# Patient Record
Sex: Female | Born: 1935 | Race: White | Hispanic: No | Marital: Married | Smoking: Never smoker
Health system: Southern US, Community
[De-identification: ages and names within clinical notes are randomized; demographics above are authoritative.]

## PROBLEM LIST (undated history)

## (undated) DIAGNOSIS — E079 Disorder of thyroid, unspecified: Secondary | ICD-10-CM

## (undated) DIAGNOSIS — Z950 Presence of cardiac pacemaker: Secondary | ICD-10-CM

## (undated) DIAGNOSIS — I4891 Unspecified atrial fibrillation: Secondary | ICD-10-CM

## (undated) DIAGNOSIS — I10 Essential (primary) hypertension: Secondary | ICD-10-CM

## (undated) HISTORY — PX: KNEE SURGERY: SHX244

---

## 2005-10-14 ENCOUNTER — Ambulatory Visit: Payer: Self-pay | Admitting: Unknown Physician Specialty

## 2006-05-30 ENCOUNTER — Other Ambulatory Visit: Payer: Self-pay

## 2006-05-30 ENCOUNTER — Inpatient Hospital Stay: Payer: Self-pay | Admitting: Internal Medicine

## 2006-07-11 ENCOUNTER — Other Ambulatory Visit: Payer: Self-pay

## 2006-07-11 ENCOUNTER — Inpatient Hospital Stay: Payer: Self-pay | Admitting: Internal Medicine

## 2006-09-03 ENCOUNTER — Emergency Department: Payer: Self-pay | Admitting: Emergency Medicine

## 2006-11-04 ENCOUNTER — Ambulatory Visit: Payer: Self-pay | Admitting: Internal Medicine

## 2006-12-11 ENCOUNTER — Inpatient Hospital Stay: Payer: Self-pay | Admitting: Internal Medicine

## 2006-12-11 ENCOUNTER — Other Ambulatory Visit: Payer: Self-pay

## 2009-04-25 ENCOUNTER — Ambulatory Visit: Payer: Self-pay | Admitting: Internal Medicine

## 2009-06-19 ENCOUNTER — Ambulatory Visit: Payer: Self-pay | Admitting: Cardiology

## 2009-06-20 ENCOUNTER — Ambulatory Visit: Payer: Self-pay | Admitting: Cardiology

## 2009-08-05 ENCOUNTER — Emergency Department: Payer: Self-pay | Admitting: Emergency Medicine

## 2010-02-06 ENCOUNTER — Ambulatory Visit: Payer: Self-pay | Admitting: Ophthalmology

## 2010-02-28 ENCOUNTER — Ambulatory Visit: Payer: Self-pay | Admitting: Ophthalmology

## 2010-05-16 ENCOUNTER — Ambulatory Visit: Payer: Self-pay | Admitting: Otolaryngology

## 2010-05-17 ENCOUNTER — Ambulatory Visit: Payer: Self-pay | Admitting: Otolaryngology

## 2010-05-18 LAB — PATHOLOGY REPORT

## 2011-08-01 ENCOUNTER — Ambulatory Visit: Payer: Self-pay | Admitting: Dermatology

## 2011-11-15 LAB — COMPREHENSIVE METABOLIC PANEL
Albumin: 3.4 g/dL (ref 3.4–5.0)
Bilirubin,Total: 1.4 mg/dL — ABNORMAL HIGH (ref 0.2–1.0)
Calcium, Total: 9.2 mg/dL (ref 8.5–10.1)
Co2: 29 mmol/L (ref 21–32)
Creatinine: 0.48 mg/dL — ABNORMAL LOW (ref 0.60–1.30)
EGFR (African American): >60
Glucose: 88 mg/dL (ref 65–99)
Osmolality: 261 (ref 275–301)
Potassium: 3.8 mmol/L (ref 3.5–5.1)
SGOT(AST): 31 U/L (ref 15–37)
Sodium: 130 mmol/L — ABNORMAL LOW (ref 136–145)

## 2011-11-15 LAB — URINALYSIS, COMPLETE
Bacteria: NONE SEEN
Bilirubin,UR: NEGATIVE
Ph: 6 (ref 4.5–8.0)
Specific Gravity: 1.009 (ref 1.003–1.030)
Squamous Epithelial: <1
WBC UR: 9 /HPF (ref 0–5)

## 2011-11-15 LAB — CBC
HCT: 43 % (ref 35.0–47.0)
HGB: 14.3 g/dL (ref 12.0–16.0)
MCHC: 33.3 g/dL (ref 32.0–36.0)
Platelet: 165 10*3/uL (ref 150–440)
RDW: 13.2 % (ref 11.5–14.5)
WBC: 6.7 10*3/uL (ref 3.6–11.0)

## 2011-11-15 LAB — TROPONIN I: Troponin-I: <0.02 ng/mL

## 2011-11-15 LAB — PROTIME-INR: Prothrombin Time: 13.8 secs (ref 11.5–14.7)

## 2011-11-16 ENCOUNTER — Inpatient Hospital Stay: Payer: Self-pay | Admitting: Internal Medicine

## 2011-11-17 LAB — CBC WITH DIFFERENTIAL/PLATELET
Basophil #: 0 10*3/uL (ref 0.0–0.1)
Eosinophil #: 0.2 10*3/uL (ref 0.0–0.7)
Eosinophil %: 2.7 %
Lymphocyte #: 0.9 10*3/uL — ABNORMAL LOW (ref 1.0–3.6)
Lymphocyte %: 15.8 %
MCV: 96 fL (ref 80–100)
Monocyte #: 0.8 10*3/uL — ABNORMAL HIGH (ref 0.0–0.7)
Monocyte %: 13.4 %
Neutrophil #: 3.8 10*3/uL (ref 1.4–6.5)
Neutrophil %: 67.6 %
Platelet: 156 10*3/uL (ref 150–440)
RBC: 3.89 10*6/uL (ref 3.80–5.20)
RDW: 13.2 % (ref 11.5–14.5)
WBC: 5.7 10*3/uL (ref 3.6–11.0)

## 2011-11-17 LAB — BASIC METABOLIC PANEL
Anion Gap: 11 (ref 7–16)
Chloride: 93 mmol/L — ABNORMAL LOW (ref 98–107)
Co2: 26 mmol/L (ref 21–32)
Creatinine: 0.41 mg/dL — ABNORMAL LOW (ref 0.60–1.30)
EGFR (African American): >60
Osmolality: 259 (ref 275–301)
Sodium: 130 mmol/L — ABNORMAL LOW (ref 136–145)

## 2012-02-17 ENCOUNTER — Emergency Department: Payer: Self-pay | Admitting: Emergency Medicine

## 2012-02-17 LAB — COMPREHENSIVE METABOLIC PANEL
Albumin: 3.9 g/dL (ref 3.4–5.0)
Alkaline Phosphatase: 44 U/L — ABNORMAL LOW (ref 50–136)
Anion Gap: 9 (ref 7–16)
BUN: 16 mg/dL (ref 7–18)
Bilirubin,Total: 2.1 mg/dL — ABNORMAL HIGH (ref 0.2–1.0)
Calcium, Total: 9.2 mg/dL (ref 8.5–10.1)
Chloride: 87 mmol/L — ABNORMAL LOW (ref 98–107)
Co2: 31 mmol/L (ref 21–32)
EGFR (African American): >60
Potassium: 4.2 mmol/L (ref 3.5–5.1)
Sodium: 127 mmol/L — ABNORMAL LOW (ref 136–145)
Total Protein: 7.9 g/dL (ref 6.4–8.2)

## 2012-02-17 LAB — URINALYSIS, COMPLETE
Bilirubin,UR: NEGATIVE
Glucose,UR: NEGATIVE mg/dL (ref 0–75)
Leukocyte Esterase: NEGATIVE
Ph: 6 (ref 4.5–8.0)
Protein: NEGATIVE
RBC,UR: 5 /HPF (ref 0–5)
Specific Gravity: 1.017 (ref 1.003–1.030)
Squamous Epithelial: NONE SEEN

## 2012-02-17 LAB — CBC
HGB: 13.4 g/dL (ref 12.0–16.0)
MCH: 32.2 pg (ref 26.0–34.0)
MCHC: 33.1 g/dL (ref 32.0–36.0)
Platelet: 211 10*3/uL (ref 150–440)
RBC: 4.16 10*6/uL (ref 3.80–5.20)
RDW: 15 % — ABNORMAL HIGH (ref 11.5–14.5)
WBC: 7.6 10*3/uL (ref 3.6–11.0)

## 2012-02-17 LAB — TROPONIN I: Troponin-I: <0.02 ng/mL

## 2012-02-17 LAB — PROTIME-INR: INR: 1.1

## 2012-05-22 ENCOUNTER — Encounter: Payer: Self-pay | Admitting: Family Medicine

## 2013-02-24 IMAGING — CT CT HEAD WITHOUT CONTRAST
1 series · 16 of 30 positions shown, 20 images · non-contrast
Comparison: none

REASON FOR EXAM: weak
COMMENTS:

PROCEDURE:     CT  - CT HEAD WITHOUT CONTRAST  - November 15, 2011  [DATE]
RESULT:     History: Weak.
Comparison Study: No recent.

[Series 2: soft tissue · axial · 0.42mm/px · z∈[+364,+498]mm · 16 of 31 slices shown, 20 images]
[im 2/31  brain]
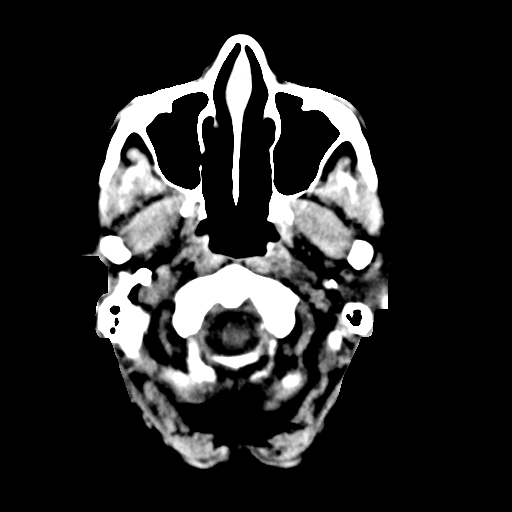
[im 2/31  bone]
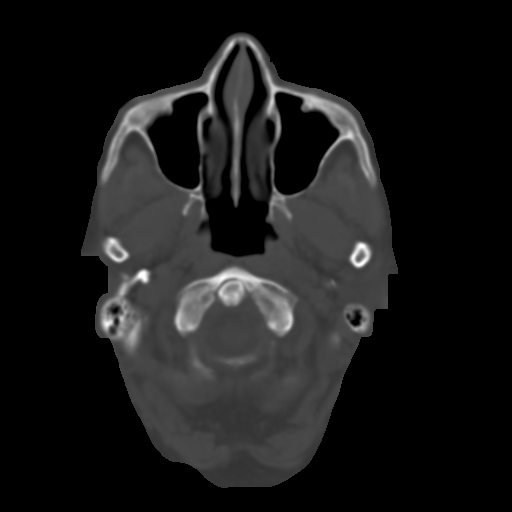
[im 4/31  brain]
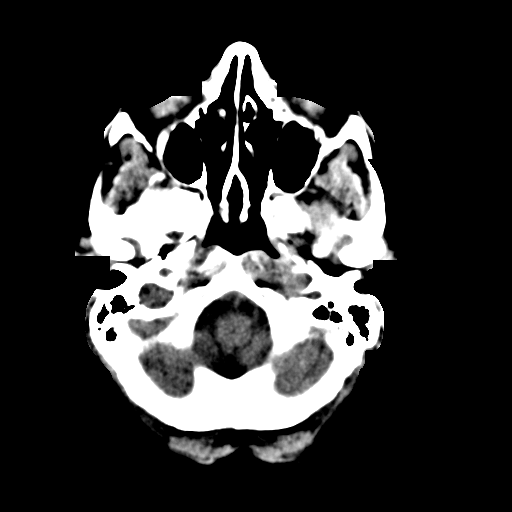
[im 6/31  brain]
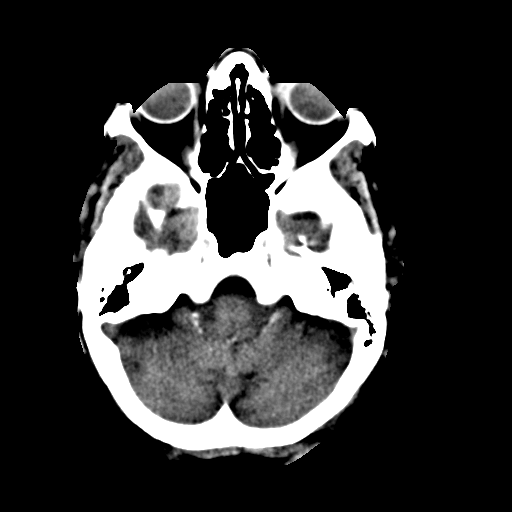
[im 8/31  brain]
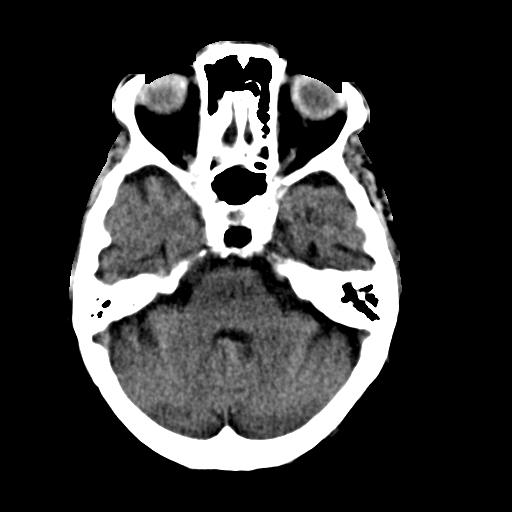
[im 9/31  brain]
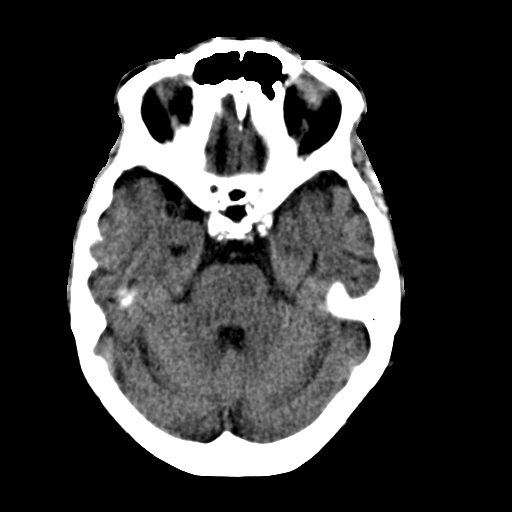
[im 9/31  bone]
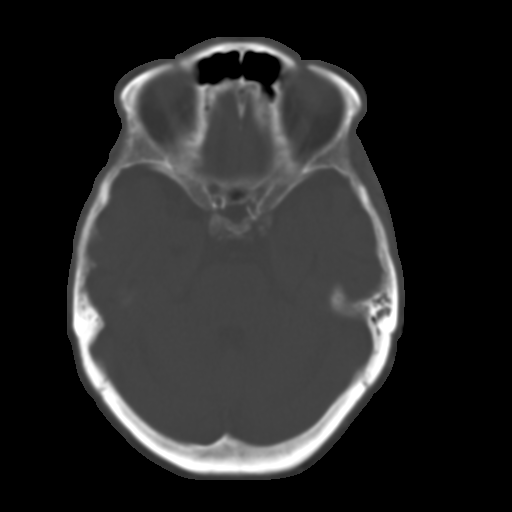
[im 11/31  brain]
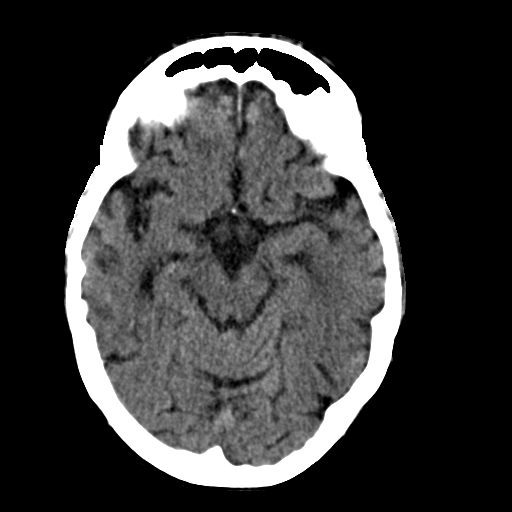
[im 13/31  brain]
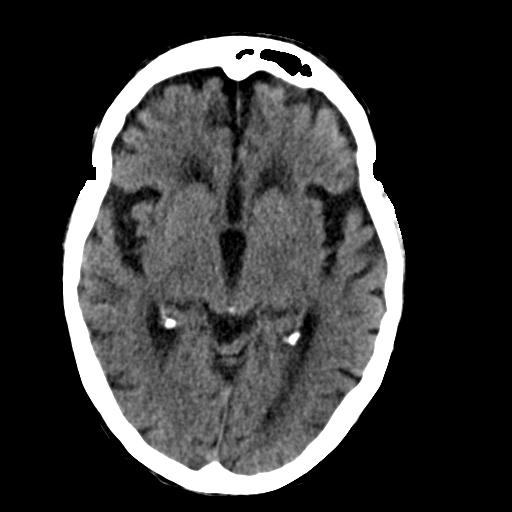
[im 15/31  brain]
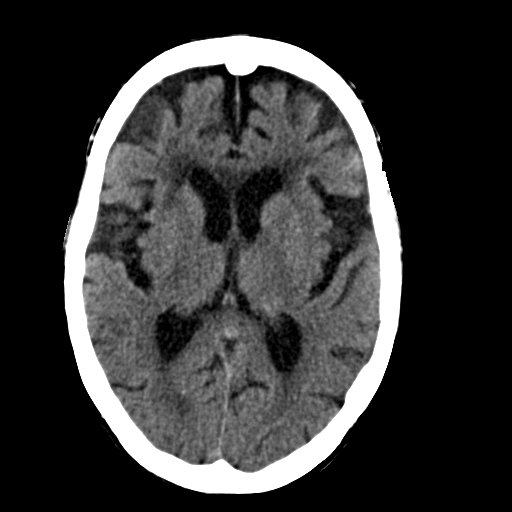
[im 16/31  brain]
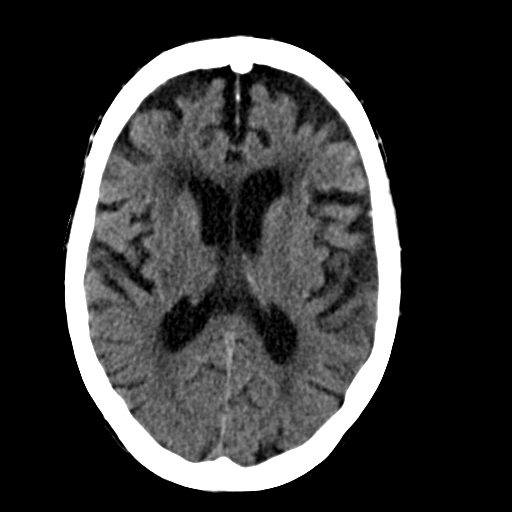
[im 16/31  bone]
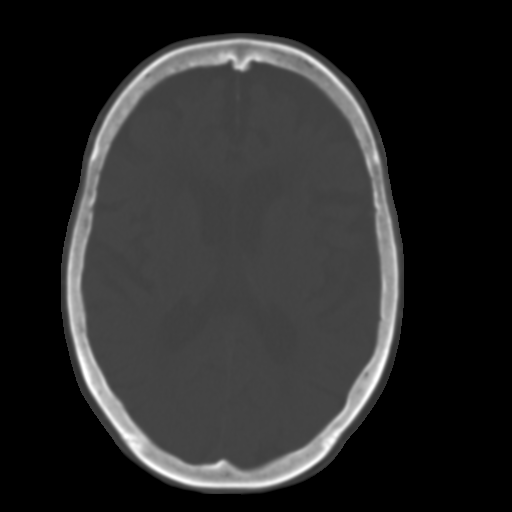
[im 18/31  brain]
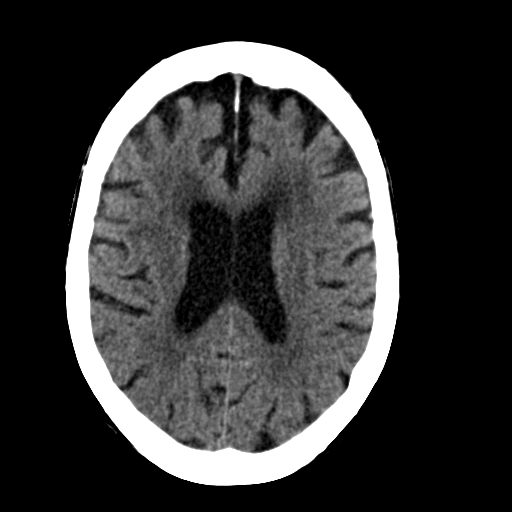
[im 20/31  brain]
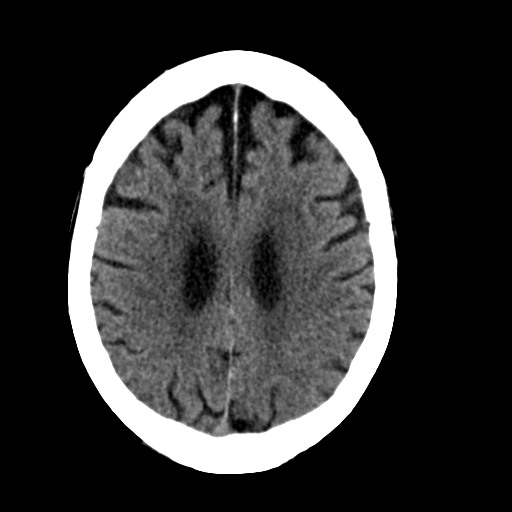
[im 22/31  brain]
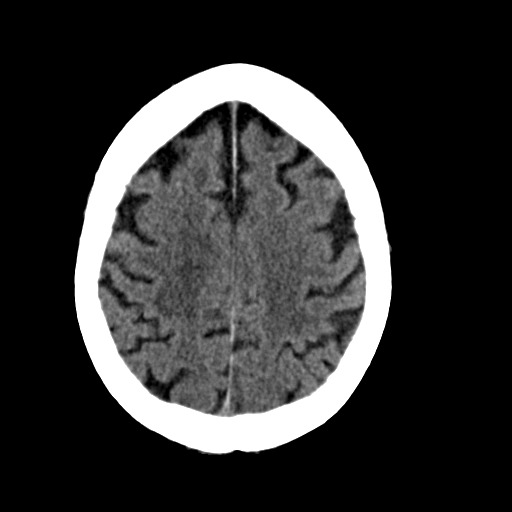
[im 23/31  brain]
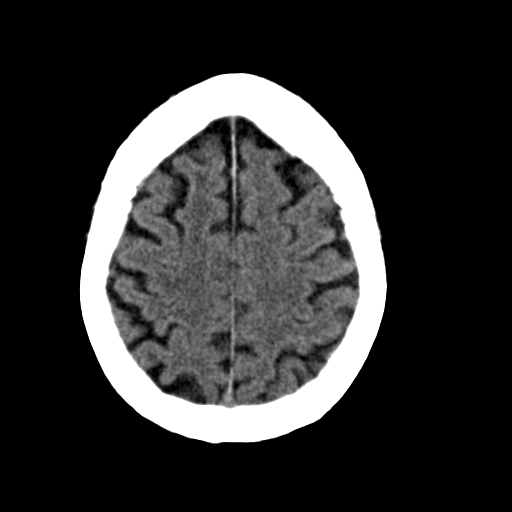
[im 23/31  bone]
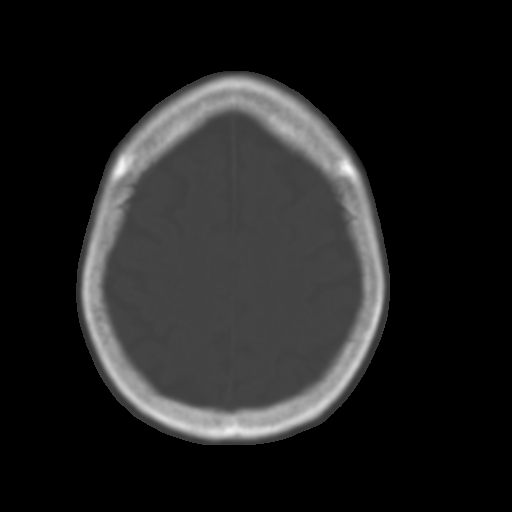
[im 25/31  brain]
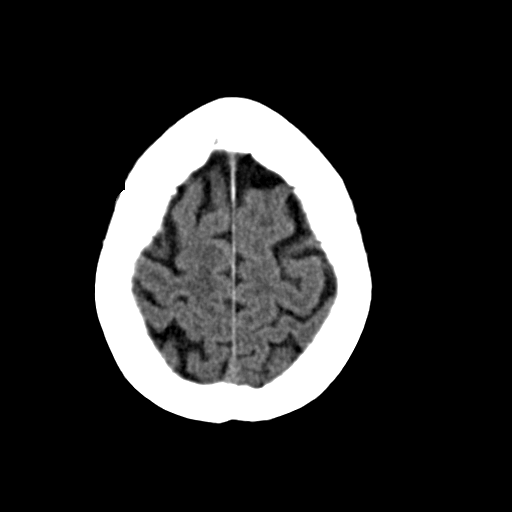
[im 27/31  brain]
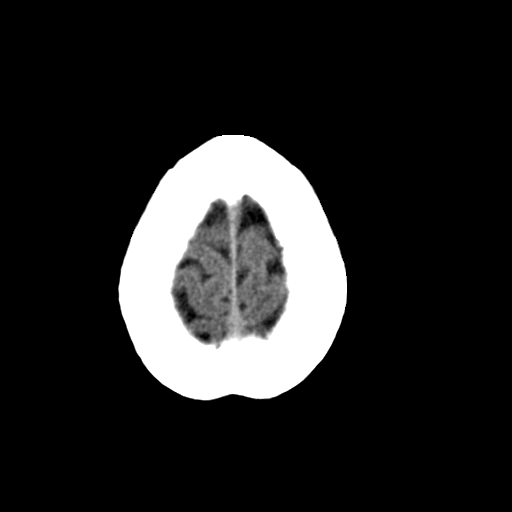
[im 29/31  brain]
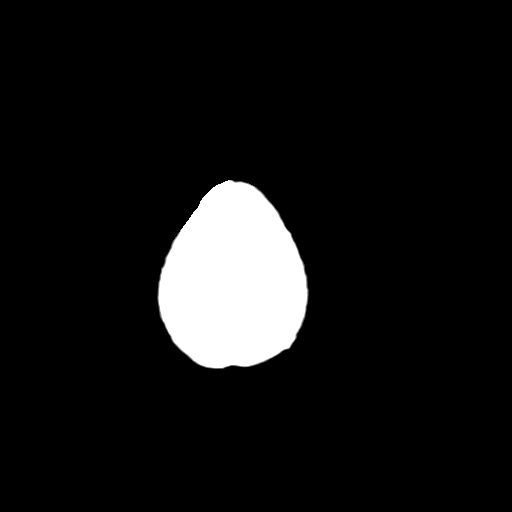

[16 of 30 positions shown; findings below may reference images not displayed]

FINDINGS: Standard nonenhanced scan obtained. No mass. No hemorrhage. No
hydrocephalus. White matter changes consistent with chronic ischemia. No
acute bony abnormality.
IMPRESSION: No acute abnormality.

## 2014-10-03 ENCOUNTER — Emergency Department (HOSPITAL_COMMUNITY): Payer: Medicare Other

## 2014-10-03 ENCOUNTER — Emergency Department (HOSPITAL_COMMUNITY)
Admission: EM | Admit: 2014-10-03 | Discharge: 2014-10-03 | Disposition: A | Discharge status: Home / Self Care | Payer: Medicare Other | Attending: Emergency Medicine | Admitting: Emergency Medicine

## 2014-10-03 ENCOUNTER — Encounter (HOSPITAL_COMMUNITY): Payer: Self-pay | Admitting: *Deleted

## 2014-10-03 DIAGNOSIS — S0003XA Contusion of scalp, initial encounter: Secondary | ICD-10-CM | POA: Diagnosis not present

## 2014-10-03 DIAGNOSIS — W01198A Fall on same level from slipping, tripping and stumbling with subsequent striking against other object, initial encounter: Secondary | ICD-10-CM | POA: Insufficient documentation

## 2014-10-03 DIAGNOSIS — Y9289 Other specified places as the place of occurrence of the external cause: Secondary | ICD-10-CM | POA: Insufficient documentation

## 2014-10-03 DIAGNOSIS — Y9301 Activity, walking, marching and hiking: Secondary | ICD-10-CM | POA: Insufficient documentation

## 2014-10-03 DIAGNOSIS — W19XXXA Unspecified fall, initial encounter: Secondary | ICD-10-CM

## 2014-10-03 DIAGNOSIS — I10 Essential (primary) hypertension: Secondary | ICD-10-CM | POA: Diagnosis not present

## 2014-10-03 DIAGNOSIS — Z8639 Personal history of other endocrine, nutritional and metabolic disease: Secondary | ICD-10-CM | POA: Insufficient documentation

## 2014-10-03 DIAGNOSIS — Y998 Other external cause status: Secondary | ICD-10-CM | POA: Insufficient documentation

## 2014-10-03 DIAGNOSIS — T148XXA Other injury of unspecified body region, initial encounter: Secondary | ICD-10-CM

## 2014-10-03 DIAGNOSIS — S0990XA Unspecified injury of head, initial encounter: Secondary | ICD-10-CM | POA: Diagnosis present

## 2014-10-03 HISTORY — DX: Unspecified atrial fibrillation: I48.91

## 2014-10-03 HISTORY — DX: Essential (primary) hypertension: I10

## 2014-10-03 HISTORY — DX: Presence of cardiac pacemaker: Z95.0

## 2014-10-03 HISTORY — DX: Disorder of thyroid, unspecified: E07.9

## 2014-10-03 NOTE — ED Provider Notes (Signed)
CSN: [id number]     Arrival date & time 10/03/14  1747 History   First MD Initiated Contact with Patient 10/03/14 1753     Chief Complaint  Patient presents with  . Fall     (Consider location/radiation/quality/duration/timing/severity/associated sxs/prior Treatment) Patient is a 80 y.o. female presenting with fall.  Fall This is a recurrent problem. The current episode started today. The problem occurs rarely. The problem has been unchanged. Associated symptoms include headaches. Pertinent negatives include no abdominal pain, chest pain, chills, fatigue, fever, myalgias or rash. Nothing aggravates the symptoms. She has tried nothing for the symptoms.    Past Medical History  Diagnosis Date  . Hypertension   . A-fib   . Thyroid disease    History reviewed. No pertinent past surgical history. No family history on file. History  Substance Use Topics  . Smoking status: Never Smoker   . Smokeless tobacco: Not on file  . Alcohol Use: No   OB History    No data available     Review of Systems  Constitutional: Negative for fever, chills and fatigue.  Cardiovascular: Negative for chest pain.  Gastrointestinal: Negative for abdominal pain.  Genitourinary: Negative for pelvic pain.  Musculoskeletal: Negative for myalgias.  Skin: Negative for rash.  Neurological: Positive for headaches.  All other systems reviewed and are negative.     Allergies  Review of patient's allergies indicates no known allergies.  Home Medications   Prior to Admission medications   Not on File   BP 202/112 mmHg  Pulse 81  Temp(Src) 98.3 F (36.8 C) (Oral)  Resp 18  Ht 5\' 6"  (1.676 m)  Wt 130 lb (58.968 kg)  BMI 20.99 kg/m2  SpO2 95% Physical Exam  Constitutional: She is oriented to person, place, and time. She appears well-developed and well-nourished.  HENT:  Head: Normocephalic.  Large hematoma to the posterior scalp. No palpable step-offs or deformities.  Eyes: Conjunctivae and EOM  are normal. Right eye exhibits no discharge. Left eye exhibits no discharge.  Cardiovascular: Normal rate and regular rhythm.   Pulmonary/Chest: Effort normal and breath sounds normal. No respiratory distress.  Abdominal: Soft. She exhibits no distension. There is no tenderness. There is no rebound.  Musculoskeletal: Normal range of motion. She exhibits no edema or tenderness.  Neurological: She is alert and oriented to person, place, and time.  No altered mental status, able to give full seemingly accurate history.  Face is symmetric, EOM's intact, pupils equal and reactive, vision intact, tongue and uvula midline without deviation Upper and Lower extremity motor 5/5, intact pain perception in distal extremities, 2+ reflexes in biceps, patella and achilles tendons. Finger to nose normal, heel to shin normal. Walks without assistance or evident ataxia.    Skin: Skin is warm and dry.  Nursing note and vitals reviewed.   ED Course  Procedures (including critical care time) Labs Review Labs Reviewed - No data to display  Imaging Review No results found.   EKG Interpretation None      MDM   Final diagnoses:  None    80 year old female on Peridex for her A. fib presents to the emergency department as a witnessed fall. Her daughter was with her walking when the patient slipped on a stick fell backwards and hit her head. No loss of consciousness no altered mental status confusion or other neurologic problems since the incident. She has a normal neurologic exam here with a large hematoma to her posterior scalp. She has a negative  CT scan. They will A relate on her own. She is stable for discharge home. She was given instructions about delayed bleeding. All questions were answered with her and her caregiver. Also talked about the frequency of her falls and possible need for intervention on that front.    Marily MemosJason Sarrinah Gardin, MD 10/04/14 0111

## 2014-10-03 NOTE — ED Notes (Signed)
Patient was out walking today and tripped over branch, patient with hematoma to posterior head, no bleeding at this time, patient denies loc, patient a/o x 4 at time of arrival,

## 2014-10-03 NOTE — Discharge Instructions (Signed)
You could have a delayed head bleed. These are rare and impossible to predict but are more likely in people taking blood thinners such as yourself. If you start having worsening headache, neurologic symptoms like weakness or numbness or any other concerning symptoms please return to the emergency departmenbt.

## 2014-10-03 NOTE — ED Provider Notes (Signed)
80 year old female tripped and fell suffering a hematoma to the right parietal area. She is anticoagulated for atrial fibrillation. Exam shows a large hematoma in the right parieto-occipital area but no other obvious injury. She is neurologically intact. CT shows no evidence of intracranial injury. She is discharged with instructions to observe for developing signs of delayed bleeding.  I saw and evaluated the patient, reviewed the resident's note and I agree with the findings and plan.    Dione Boozeavid Abubakr Wieman, MD 10/03/14 [id number]

## 2014-10-03 NOTE — ED Notes (Signed)
Pt to CT at this time.

## 2015-02-19 NOTE — H&P (Signed)
PATIENT NAME:  Kathryn Woodard, Kathryn Woodard MR#:  [id number] DATE OF BIRTH:  17-Nov-1919  DATE OF ADMISSION:  11/16/2011  REFERRING PHYSICIAN: Dr. Darnelle Catalan.   FAMILY PHYSICIAN: Dr. Burnadette Pop.   REASON FOR ADMISSION: Altered mental status with lower extremity weakness worrisome for stroke.   HISTORY OF PRESENT ILLNESS: The patient is an 81 year old female with a history of chronic atrial fibrillation and sick sinus syndrome, status post pacemaker implant who presents to the Emergency Room with a two-day history of worsening lower extremity weakness associated with slurred speech and altered mental status. Had been treated recently by her primary care physician for neck pain with Vicodin and Flexeril. Last dose of Flexeril was this morning. She presents to the Emergency Room where head CT was unremarkable. Her exam was nonfocal except for some confusion. She is now admitted for further evaluation.   PAST MEDICAL HISTORY:  1. Chronic atrial fibrillation.  2. Sick sinus syndrome, status post pacemaker implant.  3. Benign hypertension.  4. Remote history of cardiac arrest.  5. Osteoarthritis.  6. Status post knee surgery.  7. Status post hysterectomy.   MEDICATIONS:  1. Zoloft 50 mg p.o. daily.  2. Pradaxa 75 mg p.o. b.i.d.  3. Toprol-XL 25 mg p.o. daily.  4. Hydrochlorothiazide 25 mg p.o. daily.   ALLERGIES: No known drug allergies.   SOCIAL HISTORY: The patient lives with her son. No history of alcohol or tobacco abuse.   FAMILY HISTORY: Positive for hypertension and coronary artery disease.   REVIEW OF SYSTEMS: CONSTITUTIONAL: No fever or change in weight. EYES: No blurred or double vision. No glaucoma. ENT: No tinnitus or hearing loss. No nasal discharge or bleeding. No difficulty swallowing. RESPIRATORY: No cough or wheezing. No hemoptysis. No painful respiration. CARDIOVASCULAR: No chest pain or palpitations. No syncope. GASTROINTESTINAL: No nausea, vomiting, or diarrhea. No abdominal pain. Has had  constipation. GU: No dysuria or hematuria. No incontinence. ENDOCRINE: No polyuria or polydipsia. No heat or cold intolerance. HEMATOLOGIC: The patient denies anemia, easy bruising, or bleeding. LYMPHATIC: No swollen glands. MUSCULOSKELETAL: The patient denies pain in her neck, back, shoulders, knees, or hips. No gout. NEUROLOGIC: No numbness or migraines. Denies previous stroke or seizures. Does have generalized weakness. PSYCH: The patient denies anxiety, insomnia, or depression.   PHYSICAL EXAMINATION:  GENERAL: The patient is elderly, chronically ill-appearing, but in no acute distress.   VITAL SIGNS: Vital signs are currently remarkable for a blood pressure of 153/88 with a heart rate of 72 and a respiratory rate of 18. She is afebrile.   HEENT: Normocephalic, atraumatic. Pupils equally round and reactive to light and accommodation. Extraocular movements are intact. Sclerae are anicteric. Conjunctivae are clear. Oropharynx is clear.   NECK: Supple without jugular venous distention or bruits. No adenopathy or thyromegaly is noted.   LUNGS: Clear to auscultation and percussion without wheezes, rales, or rhonchi. No dullness.   CARDIAC: Regular rate and rhythm with normal S1 and S2. No significant rubs, murmurs, or gallops. PMI is nondisplaced. Chest wall is nontender.   ABDOMEN: Soft, nontender, with normoactive bowel sounds. No organomegaly or masses were appreciated. No hernias or bruits were noted.   EXTREMITIES: Without clubbing, cyanosis, or edema. Pulses were 1+ bilaterally.   SKIN: Warm and dry without rash or lesions.   NEUROLOGIC: Cranial nerves II through XII grossly intact. Deep tendon reflexes were symmetric. Motor and sensory exam is nonfocal.   PSYCH: Exam revealed a patient who is alert and oriented to person, place, and time. She was  cooperative and used good judgment.   LABORATORY, RADIOLOGICAL AND DIAGNOSTIC DATA: Urinalysis was unremarkable. Chest x-ray was negative.  EKG revealed a paced rhythm with no obvious changes. Head CT was negative for acute stroke or bleed. Troponin was less than 0.02. White count was 6.7 with a hemoglobin of 14.3. Glucose was 88 with a BUN of 14 and a creatinine of 0.48 with a sodium of 130 and a potassium of 3.8.   ASSESSMENT:  1. Altered mental status with lower extremity weakness worrisome for acute stroke syndrome.  2. Sick sinus syndrome, status post pacemaker implant.  3. Chronic atrial fibrillation, on Pradaxa.  4. Hyponatremia.  5. Benign hypertension, stable.   PLAN:  1. The patient will be admitted to telemetry with Pradaxa and aspirin.  2. Will continue her other medications.  3. Will begin normal saline and follow her sodium closely.  4. We will obtain an echocardiogram and carotid Doppler's because of her strokelike symptoms. Cannot do an MRI because of her pacer.  5. Will consult physical therapy and speech therapy.  6. Social work consult for discharge planning.  7. Follow up routine labs in the morning.  8. Further treatment and evaluation will depend upon the patient's progress.   TOTAL TIME SPENT ON ADMISSION: 50 minutes.   ____________________________ Duane LopeJeffrey D. Judithann SheenSparks, MD jds:ap D: 11/16/2011 00:26:30 ET         T: 11/16/2011 08:08:45 ET                 JOB#: [id number] cc: Duane LopeJeffrey D. Judithann SheenSparks, MD, <Dictator> Marisue IvanKanhka Linthavong, MD Keala Drum Rodena Medin Avionna Bower MD ELECTRONICALLY SIGNED 11/17/2011 0:01

## 2015-04-15 ENCOUNTER — Emergency Department: Payer: Medicare Other

## 2015-04-15 ENCOUNTER — Encounter: Payer: Self-pay | Admitting: Emergency Medicine

## 2015-04-15 ENCOUNTER — Emergency Department
Admission: EM | Admit: 2015-04-15 | Discharge: 2015-04-15 | Disposition: A | Discharge status: Home / Self Care | Payer: Medicare Other | Attending: Emergency Medicine | Admitting: Emergency Medicine

## 2015-04-15 ENCOUNTER — Other Ambulatory Visit: Payer: Self-pay

## 2015-04-15 DIAGNOSIS — R112 Nausea with vomiting, unspecified: Secondary | ICD-10-CM | POA: Diagnosis not present

## 2015-04-15 DIAGNOSIS — R1030 Lower abdominal pain, unspecified: Secondary | ICD-10-CM | POA: Diagnosis present

## 2015-04-15 DIAGNOSIS — I10 Essential (primary) hypertension: Secondary | ICD-10-CM | POA: Insufficient documentation

## 2015-04-15 LAB — URINALYSIS COMPLETE WITH MICROSCOPIC (ARMC ONLY)
Bacteria, UA: NONE SEEN
Bilirubin Urine: NEGATIVE
Glucose, UA: NEGATIVE mg/dL
HGB URINE DIPSTICK: NEGATIVE
KETONES UR: NEGATIVE mg/dL
Leukocytes, UA: NEGATIVE
Nitrite: NEGATIVE
Protein, ur: NEGATIVE mg/dL
RBC / HPF: NONE SEEN RBC/hpf (ref 0–5)
SQUAMOUS EPITHELIAL / LPF: NONE SEEN
Specific Gravity, Urine: 1.016 (ref 1.005–1.030)
pH: 5 (ref 5.0–8.0)

## 2015-04-15 LAB — CBC WITH DIFFERENTIAL/PLATELET
BASOS PCT: 1 %
Basophils Absolute: 0 10*3/uL (ref 0–0.1)
Eosinophils Absolute: 0 10*3/uL (ref 0–0.7)
Eosinophils Relative: 1 %
HEMATOCRIT: 45.3 % (ref 35.0–47.0)
Hemoglobin: 15 g/dL (ref 12.0–16.0)
Lymphocytes Relative: 15 %
Lymphs Abs: 0.7 10*3/uL — ABNORMAL LOW (ref 1.0–3.6)
MCH: 31.1 pg (ref 26.0–34.0)
MCHC: 33.1 g/dL (ref 32.0–36.0)
MCV: 94.1 fL (ref 80.0–100.0)
Monocytes Absolute: 0.4 10*3/uL (ref 0.2–0.9)
Monocytes Relative: 8 %
NEUTROS ABS: 3.6 10*3/uL (ref 1.4–6.5)
Neutrophils Relative %: 75 %
Platelets: 171 10*3/uL (ref 150–440)
RBC: 4.82 MIL/uL (ref 3.80–5.20)
RDW: 13.9 % (ref 11.5–14.5)
WBC: 4.8 10*3/uL (ref 3.6–11.0)

## 2015-04-15 LAB — COMPREHENSIVE METABOLIC PANEL
ALBUMIN: 4.4 g/dL (ref 3.5–5.0)
ALK PHOS: 42 U/L (ref 38–126)
ALT: 12 U/L — ABNORMAL LOW (ref 14–54)
AST: 23 U/L (ref 15–41)
Anion gap: 6 (ref 5–15)
BUN: 15 mg/dL (ref 6–20)
CALCIUM: 9.4 mg/dL (ref 8.9–10.3)
CHLORIDE: 96 mmol/L — AB (ref 101–111)
CO2: 30 mmol/L (ref 22–32)
Creatinine, Ser: 0.6 mg/dL (ref 0.44–1.00)
GFR calc Af Amer: >60 mL/min (ref >60)
GFR calc non Af Amer: >60 mL/min (ref >60)
Glucose, Bld: 144 mg/dL — ABNORMAL HIGH (ref 65–99)
Potassium: 3.8 mmol/L (ref 3.5–5.1)
SODIUM: 132 mmol/L — AB (ref 135–145)
TOTAL PROTEIN: 8.2 g/dL — AB (ref 6.5–8.1)
Total Bilirubin: 1.1 mg/dL (ref 0.3–1.2)

## 2015-04-15 LAB — TROPONIN I: Troponin I: <0.03 ng/mL (ref <0.031)

## 2015-04-15 LAB — LIPASE, BLOOD: LIPASE: 27 U/L (ref 22–51)

## 2015-04-15 MED ORDER — IOHEXOL 240 MG/ML SOLN: 25.0000 mL | Freq: Once | INTRAMUSCULAR | Status: DC | PRN

## 2015-04-15 MED ORDER — SODIUM CHLORIDE 0.9 % IV BOLUS (SEPSIS)
1000.0000 mL | Freq: Once | INTRAVENOUS | Status: AC
  Administered 2015-04-15: 1000 mL via INTRAVENOUS

## 2015-04-15 MED ORDER — IOHEXOL 300 MG/ML  SOLN
100.0000 mL | Freq: Once | INTRAMUSCULAR | Status: AC | PRN
  Administered 2015-04-15: 100 mL via INTRAVENOUS

## 2015-04-15 NOTE — ED Provider Notes (Signed)
[facility] Emergency Department Provider Note  ____________________________________________  Time seen: Approximately 3:21 PM  I have reviewed the triage vital signs and the nursing notes.   HISTORY  Chief Complaint Abdominal Pain  History is provided by the patient's son, who is primary caretaker, as well as patient.  HPI Kathryn Woodard is a 81 y.o. female was in her normal state of health this morning.Sometime around 10 or 10:30, patient developed severe lower abdominal pain. She is associated with vomiting several times and severe crampy lower abdominal pain. Rated 10 out of 10. It was sudden in onset. There is no bloody vomitus. No diarrhea. No fever. No chest pain. No headache. No shortness of breath.  EMS was called because of the severity of vomiting and pain. Patient does feel like her lower abdomen seems slightly more swollen than usual, but at the present time denies symptoms. Evidently her symptoms evolve resolved. She denies pain at this time.  Patient does have a history of pacemaker and atrial fibrillation.   Past Medical History  Diagnosis Date  . Hypertension   . A-fib   . Thyroid disease   . Pacemaker     There are no active problems to display for this patient.   Past Surgical History  Procedure Laterality Date  . Knee surgery      No current outpatient prescriptions on file.  Allergies Review of patient's allergies indicates no known allergies.  History reviewed. No pertinent family history.  Social History History  Substance Use Topics  . Smoking status: Never Smoker   . Smokeless tobacco: Not on file  . Alcohol Use: No    Review of Systems Constitutional: No fever/chills Eyes: No visual changes. ENT: No sore throat. Cardiovascular: Denies chest pain. Respiratory: Denies shortness of breath. Gastrointestinal:  No diarrhea.  No constipation. Genitourinary: Negative for dysuria. Musculoskeletal: Negative for back  pain. Skin: Negative for rash. Neurological: Negative for headaches, focal weakness or numbness.  10-point ROS otherwise negative.  ____________________________________________   PHYSICAL EXAM:  VITAL SIGNS: ED Triage Vitals  Enc Vitals Group     BP 04/15/15 1514 151/65 mmHg     Pulse Rate 04/15/15 1514 67     Resp --      Temp 04/15/15 1514 97.7 F (36.5 C)     Temp Source 04/15/15 1514 Oral     SpO2 04/15/15 1514 88 %     Weight 04/15/15 1514 130 lb (58.968 kg)     Height 04/15/15 1514  (1.651 m)     Head Cir --      Peak Flow --      Pain Score --      Pain Loc --      Pain Edu? --      Excl. in GC? --     Constitutional: Alert and oriented, she is oriented to self and her son. She is now well oriented to date. Son states this is normal and she does have a history of some dementia. Well appearing and in no acute distress. Eyes: Conjunctivae are normal. PERRL. EOMI. Head: Atraumatic. Nose: No congestion/rhinnorhea. Mouth/Throat: Mucous membranes are moist.  Oropharynx non-erythematous. Neck: No stridor.   Cardiovascular: Normal rate, regular rhythm. Grossly normal heart sounds.  Good peripheral circulation. Respiratory: Normal respiratory effort.  No retractions. Lungs CTAB. Gastrointestinal: Soft and nontender except for minimal tenderness over the mid lower abdomen. No distention. No abdominal bruits. No CVA tenderness. Musculoskeletal: No lower extremity tenderness nor edema.  No joint effusions. Neurologic:  Normal speech and language. No gross focal neurologic deficits are appreciated. Speech is normal. No gait instability. Skin:  Skin is warm, dry and intact. No rash noted. Psychiatric: Mood and affect are normal. Speech and behavior are normal.  ____________________________________________   LABS (all labs ordered are listed, but only abnormal results are displayed)  Labs Reviewed  CBC WITH DIFFERENTIAL/PLATELET - Abnormal; Notable for the following:     Lymphs Abs 0.7 (*)    All other components within normal limits  COMPREHENSIVE METABOLIC PANEL - Abnormal; Notable for the following:    Sodium 132 (*)    Chloride 96 (*)    Glucose, Bld 144 (*)    Total Protein 8.2 (*)    ALT 12 (*)    All other components within normal limits  URINALYSIS COMPLETEWITH MICROSCOPIC ([facility] ONLY) - Abnormal; Notable for the following:    Color, Urine YELLOW (*)    APPearance CLEAR (*)    All other components within normal limits  LIPASE, BLOOD  TROPONIN I   ____________________________________________  EKG  Interpreted and reviewed by me. Ventricular paced rhythm Rate 80, QRS 176, QTc 522. ____________________________________________  RADIOLOGY  CT Abdomen Pelvis W Contrast (Final result) Result time: 04/15/15 18:51:39   Final result by Rad Results In Interface (04/15/15 18:51:39)   Narrative:   CLINICAL DATA: Abdominal pain, vomiting, nausea  EXAM: CT ABDOMEN AND PELVIS WITH CONTRAST  TECHNIQUE: Multidetector CT imaging of the abdomen and pelvis was performed using the standard protocol following bolus administration of intravenous contrast.  CONTRAST: OMNIPAQUE IOHEXOL 300 MG/ML SOLN  COMPARISON: CT 04/25/2009  FINDINGS: Lower chest: Trace left and small right pleural effusion with associated compressive atelectasis. Partly loculated effusion on the right. Cardiomegaly with biatrial prominence partly visualized along with presumed pacer leads.  Hepatobiliary: Normal appearance to the liver and gallbladder.  Pancreas: Normal  Spleen: 4 mm hypodense splenic lesion image 21 is noted, statistically a benign finding such as lymphangioma or hemangioma. Other scattered 3-4 mm hypodense splenic lesions are also identified on images 17 and 20, respectively.  Adrenals/Urinary Tract: Adrenal glands are normal. Too small to characterize 5 mm right mid renal cortical hypodense lesion is statistically most likely a cyst  and stable. No hydroureteronephrosis. No radiopaque renal, ureteral, or bladder calculus.  Stomach/Bowel: Moderate stool burden. No bowel wall thickening or focal segmental dilatation is identified. Colonic diverticuli noted without evidence for diverticulitis. Appendix not definitely identified but no secondary evidence for acute appendicitis is seen.  Vascular/Lymphatic: Moderate to severe atheromatous aortic calcification with aortic ectasia but no aneurysm. No lymphadenopathy.  Other: Uterus and ovaries presumed surgically absent, although there could be residual left ovarian tissue identified image 55, for example. No free air or fluid.  Musculoskeletal: Rightward curvature of the thoracic spine centered at T8. No acute osseous abnormality.  IMPRESSION: No acute intra-abdominal or pelvic pathology.  Partly loculated right and trace left pleural effusions with associated compressive atelectasis.  Subcentimeter hypodense splenic lesions as described above, statistically most likely a benign finding such as hemangiomas or lymphangiomas.   Electronically Signed By: Christiana Pellant M.D. On: 04/15/2015 18:51          DG Chest 2 View (Final result) Result time: 04/15/15 15:52:22   Final result by Rad Results In Interface (04/15/15 15:52:22)   Narrative:   CLINICAL DATA: Upper abdominal pain. History of atrial fibrillation and hypertension  EXAM: CHEST 2 VIEW  COMPARISON: February 17, 2012  FINDINGS: There is  a focal right pleural effusion with right base atelectasis. Lungs elsewhere clear. Heart is upper normal in size with pulmonary vascularity within normal limits. Pacemaker lead is attached to the right ventricle. No adenopathy. There is atherosclerotic change in the aorta. There is mild degenerative change in the thoracic spine. There is lower thoracic dextroscoliosis.  IMPRESSION: Right pleural effusion with right base atelectasis. Lungs  elsewhere clear. Pacemaker present with lead attached to right ventricle. Atherosclerotic calcification noted.     ____________________________________________   PROCEDURES  Procedure(s) performed: None  Critical Care performed: No  ____________________________________________   INITIAL IMPRESSION / ASSESSMENT AND PLAN / ED COURSE  Pertinent labs & imaging results that were available during my care of the patient were reviewed by me and considered in my medical decision making (see chart for details).  81 year old female presents with sudden onset severe lower abdominal pain with emesis. This is nonbloody. Described as "yellow orange" by son. Pain lasted and was severe for approximately 1-1/2 hours today. presently resolved. She denies any other concerns.  Patient's O2 saturation was initially documented as 88 on room air, though she currently demonstrates an O2 sat of 94% on room air without distress or respiratory complaint. No cardio pulmonary symptoms. EKG shows paced rhythm. Based on her symptoms and the severity that they were, and her age, I will obtain CT imaging the abdomen and pelvis to further evaluate for acute etiology. Quite possible this may represent acute gastritis, food poisoning, or other etiology based on her reassuring and painless test now, but alternative considerations including perforation and/or bowel obstruction are also considered. UTI is also on differential.  ----------------------------------------- 7:58 PM on 04/15/2015 -----------------------------------------  Evaluated the patient. She is pain-free. She denies any symptoms. She her son and daughter present. I did discuss with her her CAT scan findings, in addition I discussed her CT findings with Dr. Lady Gary of cardiology. Based on her having any acute pulmonary symptoms, her oxygen saturations currently 94% on room air and she denies any shortness of breath or cough fever or chills. This likely  represents a chronic finding, but no previous CT to compare to.  Regarding her abdominal pain she is asymptomatic. I think she can safely be discharged. Discussed with cardiology, they can see her on Monday or Tuesday for follow-up and reevaluation. Patient may need an echo to determine if this could be a new finding of bilateral pleural effusions which may be suggestive of CHF, however it is unclear.  EKG is be paced, troponin negative. Again no cardio palmar symptoms at all her abdominal symptoms are resolved.  Discharge to home. I did discuss careful return precautions including return of any abdominal pain, chest pain, trouble breathing, shortness of breath swelling, or other concerns arise. Patient and family very agreeable. ____________________________________________   FINAL CLINICAL IMPRESSION(S) / ED DIAGNOSES  Final diagnoses:  Non-intractable vomiting with nausea, vomiting of unspecified type      Sharyn Creamer, MD 04/15/15 2002

## 2015-04-15 NOTE — Discharge Instructions (Signed)
Nausea and Vomiting  Please follow-up with Dr. Cassie Freer on Monday or Tuesday. Call Monday to make an appointment. Return to the ER right away if you develop trouble breathing, chest pain, shortness of breath, vomiting, abdominal pain, fever, or other new concerns arise.  Nausea is a sick feeling that often comes before throwing up (vomiting). Vomiting is a reflex where stomach contents come out of your mouth. Vomiting can cause severe loss of body fluids (dehydration). Children and elderly adults can become dehydrated quickly, especially if they also have diarrhea. Nausea and vomiting are symptoms of a condition or disease. It is important to find the cause of your symptoms. CAUSES   Direct irritation of the stomach lining. This irritation can result from increased acid production (gastroesophageal reflux disease), infection, food poisoning, taking certain medicines (such as nonsteroidal anti-inflammatory drugs), alcohol use, or tobacco use.  Signals from the brain.These signals could be caused by a headache, heat exposure, an inner ear disturbance, increased pressure in the brain from injury, infection, a tumor, or a concussion, pain, emotional stimulus, or metabolic problems.  An obstruction in the gastrointestinal tract (bowel obstruction).  Illnesses such as diabetes, hepatitis, gallbladder problems, appendicitis, kidney problems, cancer, sepsis, atypical symptoms of a heart attack, or eating disorders.  Medical treatments such as chemotherapy and radiation.  Receiving medicine that makes you sleep (general anesthetic) during surgery. DIAGNOSIS Your caregiver may ask for tests to be done if the problems do not improve after a few days. Tests may also be done if symptoms are severe or if the reason for the nausea and vomiting is not clear. Tests may include:  Urine tests.  Blood tests.  Stool tests.  Cultures (to look for evidence of infection).  X-rays or other imaging  studies. Test results can help your caregiver make decisions about treatment or the need for additional tests. TREATMENT You need to stay well hydrated. Drink frequently but in small amounts.You may wish to drink water, sports drinks, clear broth, or eat frozen ice pops or gelatin dessert to help stay hydrated.When you eat, eating slowly may help prevent nausea.There are also some antinausea medicines that may help prevent nausea. HOME CARE INSTRUCTIONS   Take all medicine as directed by your caregiver.  If you do not have an appetite, do not force yourself to eat. However, you must continue to drink fluids.  If you have an appetite, eat a normal diet unless your caregiver tells you differently.  Eat a variety of complex carbohydrates (rice, wheat, potatoes, bread), lean meats, yogurt, fruits, and vegetables.  Avoid high-fat foods because they are more difficult to digest.  Drink enough water and fluids to keep your urine clear or pale yellow.  If you are dehydrated, ask your caregiver for specific rehydration instructions. Signs of dehydration may include:  Severe thirst.  Dry lips and mouth.  Dizziness.  Dark urine.  Decreasing urine frequency and amount.  Confusion.  Rapid breathing or pulse. SEEK IMMEDIATE MEDICAL CARE IF:   You have blood or brown flecks (like coffee grounds) in your vomit.  You have black or bloody stools.  You have a severe headache or stiff neck.  You are confused.  You have severe abdominal pain.  You have chest pain or trouble breathing.  You do not urinate at least once every 8 hours.  You develop cold or clammy skin.  You continue to vomit for longer than 24 to 48 hours.  You have a fever. MAKE SURE YOU:   Understand  Understand these instructions. °· Will watch your condition. °· Will get help right away if you are not doing well or get worse. °Document Released: 10/14/2005 Document Revised: 01/06/2012 Document Reviewed:  03/13/2011 °[employer]® Patient Information ©2015 [employer]. This information is not intended to replace advice given to you by your health care provider. Make sure you discuss any questions you have with your health care provider. ° °

## 2015-04-15 NOTE — ED Notes (Signed)
Patient from home (lives with son). Patient states she started having abdominal pain this morning after 10 and again after lunch, Patient has had 1 episode of vomiting and constant nausea. Abdomen is more distended than normal per patient, generalized tenderness.

## 2015-08-20 ENCOUNTER — Encounter (HOSPITAL_COMMUNITY): Payer: Self-pay

## 2015-08-20 ENCOUNTER — Inpatient Hospital Stay (HOSPITAL_COMMUNITY)
Admission: EM | Admit: 2015-08-20 | Discharge: 2015-08-23 | DRG: 065 | Disposition: A | Discharge status: Home / Self Care | Payer: Medicare Other | Attending: Student in an Organized Health Care Education/Training Program | Admitting: Student in an Organized Health Care Education/Training Program

## 2015-08-20 ENCOUNTER — Emergency Department (HOSPITAL_COMMUNITY): Payer: Medicare Other

## 2015-08-20 DIAGNOSIS — J90 Pleural effusion, not elsewhere classified: Secondary | ICD-10-CM | POA: Diagnosis present

## 2015-08-20 DIAGNOSIS — I6789 Other cerebrovascular disease: Secondary | ICD-10-CM | POA: Diagnosis not present

## 2015-08-20 DIAGNOSIS — G8194 Hemiplegia, unspecified affecting left nondominant side: Secondary | ICD-10-CM | POA: Diagnosis present

## 2015-08-20 DIAGNOSIS — R29810 Facial weakness: Secondary | ICD-10-CM | POA: Diagnosis present

## 2015-08-20 DIAGNOSIS — R4781 Slurred speech: Secondary | ICD-10-CM | POA: Diagnosis present

## 2015-08-20 DIAGNOSIS — I6340 Cerebral infarction due to embolism of unspecified cerebral artery: Secondary | ICD-10-CM | POA: Diagnosis present

## 2015-08-20 DIAGNOSIS — R531 Weakness: Secondary | ICD-10-CM | POA: Diagnosis present

## 2015-08-20 DIAGNOSIS — F329 Major depressive disorder, single episode, unspecified: Secondary | ICD-10-CM | POA: Diagnosis present

## 2015-08-20 DIAGNOSIS — I4891 Unspecified atrial fibrillation: Secondary | ICD-10-CM | POA: Diagnosis not present

## 2015-08-20 DIAGNOSIS — I10 Essential (primary) hypertension: Secondary | ICD-10-CM | POA: Diagnosis present

## 2015-08-20 DIAGNOSIS — E785 Hyperlipidemia, unspecified: Secondary | ICD-10-CM | POA: Insufficient documentation

## 2015-08-20 DIAGNOSIS — R0902 Hypoxemia: Secondary | ICD-10-CM

## 2015-08-20 DIAGNOSIS — J9811 Atelectasis: Secondary | ICD-10-CM | POA: Diagnosis present

## 2015-08-20 DIAGNOSIS — R471 Dysarthria and anarthria: Secondary | ICD-10-CM | POA: Diagnosis present

## 2015-08-20 DIAGNOSIS — I482 Chronic atrial fibrillation, unspecified: Secondary | ICD-10-CM | POA: Insufficient documentation

## 2015-08-20 DIAGNOSIS — I639 Cerebral infarction, unspecified: Secondary | ICD-10-CM | POA: Diagnosis present

## 2015-08-20 DIAGNOSIS — F0390 Unspecified dementia without behavioral disturbance: Secondary | ICD-10-CM | POA: Diagnosis not present

## 2015-08-20 DIAGNOSIS — Z8673 Personal history of transient ischemic attack (TIA), and cerebral infarction without residual deficits: Secondary | ICD-10-CM

## 2015-08-20 DIAGNOSIS — E039 Hypothyroidism, unspecified: Secondary | ICD-10-CM | POA: Diagnosis present

## 2015-08-20 DIAGNOSIS — F0150 Vascular dementia without behavioral disturbance: Secondary | ICD-10-CM | POA: Diagnosis present

## 2015-08-20 DIAGNOSIS — M6289 Other specified disorders of muscle: Secondary | ICD-10-CM | POA: Diagnosis not present

## 2015-08-20 DIAGNOSIS — Z7901 Long term (current) use of anticoagulants: Secondary | ICD-10-CM | POA: Insufficient documentation

## 2015-08-20 DIAGNOSIS — Z950 Presence of cardiac pacemaker: Secondary | ICD-10-CM | POA: Diagnosis not present

## 2015-08-20 LAB — COMPREHENSIVE METABOLIC PANEL
ALBUMIN: 3.8 g/dL (ref 3.5–5.0)
ALK PHOS: 41 U/L (ref 38–126)
ALT: 16 U/L (ref 14–54)
AST: 30 U/L (ref 15–41)
Anion gap: 9 (ref 5–15)
BUN: 12 mg/dL (ref 6–20)
CALCIUM: 9.7 mg/dL (ref 8.9–10.3)
CO2: 27 mmol/L (ref 22–32)
CREATININE: 0.58 mg/dL (ref 0.44–1.00)
Chloride: 99 mmol/L — ABNORMAL LOW (ref 101–111)
GFR calc non Af Amer: >60 mL/min (ref >60)
GLUCOSE: 110 mg/dL — AB (ref 65–99)
Potassium: 4.2 mmol/L (ref 3.5–5.1)
SODIUM: 135 mmol/L (ref 135–145)
Total Bilirubin: 1.1 mg/dL (ref 0.3–1.2)
Total Protein: 7.6 g/dL (ref 6.5–8.1)

## 2015-08-20 LAB — I-STAT CHEM 8, ED
BUN: 15 mg/dL (ref 6–20)
CHLORIDE: 97 mmol/L — AB (ref 101–111)
CREATININE: 0.6 mg/dL (ref 0.44–1.00)
Calcium, Ion: 1.14 mmol/L (ref 1.13–1.30)
GLUCOSE: 109 mg/dL — AB (ref 65–99)
HCT: 49 % — ABNORMAL HIGH (ref 36.0–46.0)
Hemoglobin: 16.7 g/dL — ABNORMAL HIGH (ref 12.0–15.0)
POTASSIUM: 4.2 mmol/L (ref 3.5–5.1)
Sodium: 135 mmol/L (ref 135–145)
TCO2: 28 mmol/L (ref 0–100)

## 2015-08-20 LAB — DIFFERENTIAL
Basophils Absolute: 0 10*3/uL (ref 0.0–0.1)
Basophils Relative: 1 %
Eosinophils Absolute: 0.1 10*3/uL (ref 0.0–0.7)
Eosinophils Relative: 3 %
LYMPHS ABS: 1 10*3/uL (ref 0.7–4.0)
LYMPHS PCT: 22 %
MONO ABS: 0.4 10*3/uL (ref 0.1–1.0)
Monocytes Relative: 8 %
NEUTROS ABS: 3.1 10*3/uL (ref 1.7–7.7)
Neutrophils Relative %: 66 %

## 2015-08-20 LAB — CBG MONITORING, ED: Glucose-Capillary: 109 mg/dL — ABNORMAL HIGH (ref 65–99)

## 2015-08-20 LAB — CBC
HCT: 43.5 % (ref 36.0–46.0)
HEMOGLOBIN: 14.6 g/dL (ref 12.0–15.0)
MCH: 31 pg (ref 26.0–34.0)
MCHC: 33.6 g/dL (ref 30.0–36.0)
MCV: 92.4 fL (ref 78.0–100.0)
PLATELETS: 144 10*3/uL — AB (ref 150–400)
RBC: 4.71 MIL/uL (ref 3.87–5.11)
RDW: 14 % (ref 11.5–15.5)
WBC: 4.7 10*3/uL (ref 4.0–10.5)

## 2015-08-20 LAB — PROTIME-INR
INR: 1.33 (ref 0.00–1.49)
PROTHROMBIN TIME: 16.6 s — AB (ref 11.6–15.2)

## 2015-08-20 LAB — APTT: aPTT: 64 seconds — ABNORMAL HIGH (ref 24–37)

## 2015-08-20 LAB — I-STAT TROPONIN, ED: Troponin i, poc: 0.01 ng/mL (ref 0.00–0.08)

## 2015-08-20 MED ORDER — SODIUM CHLORIDE 0.9 % IJ SOLN
3.0000 mL | Freq: Two times a day (BID) | INTRAMUSCULAR | Status: DC
  Administered 2015-08-21 (×2): 3 mL via INTRAVENOUS
  Administered 2015-08-22: 21:00:00 via INTRAVENOUS
  Administered 2015-08-22 – 2015-08-23 (×2): 3 mL via INTRAVENOUS

## 2015-08-20 MED ORDER — STROKE: EARLY STAGES OF RECOVERY BOOK
Freq: Once | Status: AC
  Administered 2015-08-21: 02:00:00

## 2015-08-20 MED ORDER — SERTRALINE HCL 50 MG PO TABS
50.0000 mg | ORAL_TABLET | Freq: Every day | ORAL | Status: DC
  Administered 2015-08-21 – 2015-08-23 (×3): 50 mg via ORAL
  Filled 2015-08-20 (×3): qty 1

## 2015-08-20 MED ORDER — METOPROLOL SUCCINATE ER 25 MG PO TB24
50.0000 mg | ORAL_TABLET | Freq: Every day | ORAL | Status: DC
Start: 2015-08-21 — End: 2015-08-22
  Administered 2015-08-21 – 2015-08-22 (×2): 50 mg via ORAL
  Filled 2015-08-20 (×2): qty 2

## 2015-08-20 MED ORDER — LABETALOL HCL 5 MG/ML IV SOLN: 10.0000 mg | Freq: Once | INTRAVENOUS | Status: DC

## 2015-08-20 MED ORDER — DABIGATRAN ETEXILATE MESYLATE 75 MG PO CAPS
75.0000 mg | ORAL_CAPSULE | Freq: Two times a day (BID) | ORAL | Status: DC
  Administered 2015-08-21 – 2015-08-23 (×6): 75 mg via ORAL
  Filled 2015-08-20 (×8): qty 1

## 2015-08-20 MED ORDER — METOPROLOL TARTRATE 1 MG/ML IV SOLN: 5.0000 mg | Freq: Four times a day (QID) | INTRAVENOUS | Status: DC | PRN

## 2015-08-20 MED ORDER — LEVOTHYROXINE SODIUM 50 MCG PO TABS
75.0000 ug | ORAL_TABLET | Freq: Every day | ORAL | Status: DC
  Administered 2015-08-21 – 2015-08-23 (×3): 75 ug via ORAL
  Filled 2015-08-20 (×6): qty 1

## 2015-08-20 MED ORDER — METOPROLOL TARTRATE 1 MG/ML IV SOLN
5.0000 mg | Freq: Once | INTRAVENOUS | Status: AC
  Administered 2015-08-20: 5 mg via INTRAVENOUS
  Filled 2015-08-20: qty 5

## 2015-08-20 NOTE — ED Notes (Signed)
Pt CBG, 109. Nurse was notified. 

## 2015-08-20 NOTE — ED Provider Notes (Signed)
CSN: [id number]     Arrival date & time 08/20/15  1720 History   First MD Initiated Contact with Patient 08/20/15 1731     Chief Complaint  Patient presents with  . Code Stroke    @ (Consider location/radiation/quality/duration/timing/severity/associated sxs/prior Treatment) Patient is a 81 y.o. female presenting with Acute Neurological Problem. The history is provided by the spouse.  Cerebrovascular Accident This is a new problem. The current episode started today. The problem has been unchanged. Associated symptoms include weakness. Pertinent negatives include no abdominal pain, chest pain or fever. Nothing aggravates the symptoms. She has tried nothing for the symptoms. The treatment provided no relief.    Past Medical History  Diagnosis Date  . Hypertension   . A-fib (HCC)   . Thyroid disease   . Pacemaker    Past Surgical History  Procedure Laterality Date  . Knee surgery     No family history on file. Social History  Substance Use Topics  . Smoking status: Never Smoker   . Smokeless tobacco: None  . Alcohol Use: No   OB History    No data available     Review of Systems  Unable to perform ROS: Dementia  Constitutional: Negative for fever.  Respiratory: Negative for shortness of breath.   Cardiovascular: Negative for chest pain.  Gastrointestinal: Negative for abdominal pain.  Neurological: Positive for facial asymmetry, speech difficulty and weakness.  Psychiatric/Behavioral: Positive for confusion.      Allergies  Review of patient's allergies indicates no known allergies.  Home Medications   Prior to Admission medications   Medication Sig Start Date End Date Taking? Authorizing Provider  Calcium Carbonate-Vitamin D 600-400 MG-UNIT tablet Take 2 tablets by mouth daily.   Yes Historical Provider, MD  dabigatran (PRADAXA) 75 MG CAPS capsule Take 75 mg by mouth 2 (two) times daily. 05/08/15  Yes Historical Provider, MD  levothyroxine  (SYNTHROID, LEVOTHROID) 75 MCG tablet Take 75 mcg by mouth every morning. 07/26/15  Yes Historical Provider, MD  metoprolol succinate (TOPROL-XL) 50 MG 24 hr tablet Take 50 mg by mouth daily. 08/11/15  Yes Historical Provider, MD  sertraline (ZOLOFT) 100 MG tablet Take 50 mg by mouth daily. 07/04/15  Yes Historical Provider, MD  Vitamin D, Ergocalciferol, (DRISDOL) 50000 UNITS CAPS capsule Take 1 capsule by mouth every Saturday. 08/15/15  Yes Historical Provider, MD   BP 214/95 mmHg  Pulse 78  Resp 16  Ht  (1.651 m)  Wt 130 lb (58.968 kg)  BMI 21.63 kg/m2  SpO2 96% Physical Exam  Constitutional: She appears well-developed and well-nourished. No distress.  HENT:  Head: Normocephalic and atraumatic.  Right Ear: External ear normal.  Left Ear: External ear normal.  Nose: Nose normal.  Mouth/Throat: Oropharynx is clear and moist. No oropharyngeal exudate.  Eyes: Conjunctivae and EOM are normal. Pupils are equal, round, and reactive to light. Right eye exhibits no discharge. Left eye exhibits no discharge. No scleral icterus.  Neck: Normal range of motion. Neck supple. No JVD present. No tracheal deviation present. No thyromegaly present.  Cardiovascular: Normal rate, regular rhythm and intact distal pulses.   Pulmonary/Chest: Effort normal and breath sounds normal. No stridor. No respiratory distress. She has no wheezes. She has no rales. She exhibits no tenderness.  Abdominal: Soft. She exhibits no distension.  Musculoskeletal: Normal range of motion. She exhibits no edema or tenderness.  Lymphadenopathy:    She has no cervical adenopathy.  Neurological: She is alert. She displays no tremor. A  cranial nerve deficit is present. She exhibits abnormal muscle tone. She displays no seizure activity. GCS eye subscore is 4. GCS verbal subscore is 4. GCS motor subscore is 6.  Exam limited by patient's dementia.  Difficulty following commands.  Skin: Skin is warm and dry. No rash noted. She is  not diaphoretic. No erythema. No pallor.  Psychiatric: Her speech is tangential. She is slowed. Cognition and memory are impaired.  Nursing note and vitals reviewed.   ED Course  Procedures (including critical care time) Labs Review Labs Reviewed  PROTIME-INR - Abnormal; Notable for the following:    Prothrombin Time 16.6 (*)    All other components within normal limits  APTT - Abnormal; Notable for the following:    aPTT 64 (*)    All other components within normal limits  CBC - Abnormal; Notable for the following:    Platelets 144 (*)    All other components within normal limits  COMPREHENSIVE METABOLIC PANEL - Abnormal; Notable for the following:    Chloride 99 (*)    Glucose, Bld 110 (*)    All other components within normal limits  BASIC METABOLIC PANEL - Abnormal; Notable for the following:    Sodium 133 (*)    Chloride 100 (*)    Glucose, Bld 104 (*)    All other components within normal limits  LIPID PANEL - Abnormal; Notable for the following:    Cholesterol 253 (*)    LDL Cholesterol 156 (*)    All other components within normal limits  CBG MONITORING, ED - Abnormal; Notable for the following:    Glucose-Capillary 109 (*)    All other components within normal limits  I-STAT CHEM 8, ED - Abnormal; Notable for the following:    Chloride 97 (*)    Glucose, Bld 109 (*)    Hemoglobin 16.7 (*)    HCT 49.0 (*)    All other components within normal limits  DIFFERENTIAL  CBC  I-STAT TROPOININ, ED    Imaging Review Ct Head Wo Contrast  08/20/2015  CLINICAL DATA:  Code stroke, facial droop, unable to bear weight on left side EXAM: CT HEAD WITHOUT CONTRAST TECHNIQUE: Contiguous axial images were obtained from the base of the skull through the vertex without intravenous contrast. COMPARISON:  10/03/2014 FINDINGS: No evidence of parenchymal hemorrhage or extra-axial fluid collection. No mass lesion, mass effect, or midline shift. No CT evidence of acute infarction.  Extensive subcortical white matter and periventricular small vessel ischemic changes. Intracranial atherosclerosis. Global cortical and central atrophy. Secondary ventricular prominence. The visualized paranasal sinuses are essentially clear. The mastoid air cells are unopacified. No evidence of calvarial fracture. IMPRESSION: No evidence of acute intracranial abnormality. Atrophy with extensive small vessel ischemic changes. These results were called by telephone at the time of interpretation on 08/20/2015 at 5:34 pm to Dr. Hosie Poisson, who verbally acknowledged these results. Electronically Signed   By: Charline Bills M.D.   On: 08/20/2015 17:33   I have personally reviewed and evaluated these images and lab results as part of my medical decision-making.   EKG Interpretation   Date/Time:  Sunday August 20 2015 17:43:25 EDT Ventricular Rate:  73 PR Interval:    QRS Duration: 167 QT Interval:  475 QTC Calculation: 523 R Axis:   -84 Text Interpretation:  Afib/flutter and ventricular-paced rhythm No further  analysis attempted due to paced rhythm Baseline wander in lead(s) II III  aVR aVF V3 V5 ED PHYSICIAN INTERPRETATION AVAILABLE IN [facility]  Confirmed by TEST, Record ([id number]) on 08/21/2015 6:58:47 AM      MDM   Final diagnoses:  Left-sided weakness      Patient presented as a code stroke. Her family states that she was less than normal at 2 PM for nap and then awoke with left facial drooping, left leg weakness, and confusion.   Code stroke workup obtained no acute findings noted other than reduced blood sugar. Patient was given D50 also was noted to be hypertensive was given  Blood pressure medication.   Patient's symptoms began to resolve. She was also seen in conjunction with neurology as code stroke they recommended further stroke workup admitting MRI due to history of pacemaker placement. Patient was admitted  To medicine for further management.  Patient care was discussed with  my attending, Dr. Radford PaxBeaton.   Gavin PoundJustin Jerian Morais, MD 08/21/15 1517  Nelva Nayobert Beaton, MD 08/25/15 [phone]

## 2015-08-20 NOTE — Consult Note (Signed)
Stroke Consult    Chief Complaint: left sided weakness HPI: Kathryn Woodard is an 81 y.o. female hx of HTN, A fib, s/p PPM presenting with left sided weakness and facial droop. LSW 1200, around 1600 son noted she was not talking as much and seemed weak on the left side. Upon EMS arrival noted SBP in the 200s. Blood sugar was 65. Given 1/2 amp of D50. Symptoms improved upon arrival to ED but not back to baseline. No prior CVA history.   CT head imaging reviewed, no acute process. Initial NIHSS of 4.   Date last known well: 08/20/2015 Time last known well: 1200 tPA Given: no, outside tPA window Modified Rankin: Rankin Score=0  Past Medical History  Diagnosis Date  . Hypertension   . A-fib   . Thyroid disease   . Pacemaker     Past Surgical History  Procedure Laterality Date  . Knee surgery      No family history on file. Social History:  reports that she has never smoked. She does not have any smokeless tobacco history on file. She reports that she does not drink alcohol or use illicit drugs.  Allergies: No Known Allergies   (Not in a hospital admission)  ROS: Out of a complete 14 system review, the patient complains of only the following symptoms, and all other reviewed systems are negative. +weakness  Physical Examination: There were no vitals filed for this visit. Physical Exam  Constitutional: He appears well-developed and well-nourished.  Psych: Affect appropriate to situation Eyes: No scleral injection HENT: No OP obstrucion Head: Normocephalic.  Cardiovascular: Normal rate and regular rhythm.  Respiratory: Effort normal and breath sounds normal.  GI: Soft. Bowel sounds are normal. No distension. There is no tenderness.  Skin: WDI   Neurologic Examination: Mental Status: Alert, oriented, thought content appropriate.  Speech fluent without evidence of aphasia. Mild dysarthria.  Able to follow 3 step commands without difficulty. Cranial Nerves: II: optic  discs not visualized, visual fields grossly normal, pupils equal, round, reactive to light and accommodation III,IV, VI: ptosis not present, extra-ocular motions intact bilaterally V,VII: mild flattening left NLF, facial light touch sensation normal bilaterally VIII: hearing normal bilaterally IX,X: gag reflex present XI: trapezius strength/neck flexion strength normal bilaterally XII: tongue strength normal  Motor: Right : Upper extremity    Left:     Upper extremity 5/5 deltoid       5/5 deltoid 5/5 biceps      5/5 biceps  5/5 triceps      5/5 triceps 5/5 hand grip      5/5 hand grip  Lower extremity     Lower extremity 5/5 hip flexor      5-/5 hip flexor 5/5 quadricep      5-/5 quadriceps  5/5 hamstrings     5- hamstrings 5/5 plantar flexion       5/5 plantar flexion 5/5 plantar extension     5/5 plantar extension Tone and bulk:normal tone throughout; no atrophy noted Sensory: decreased LT LLE Deep Tendon Reflexes: 1+ and symmetric throughout Plantars: Right: downgoing   Left: downgoing Cerebellar: normal finger-to-nose, and normal heel-to-shin test Gait: deferred  Laboratory Studies:   Basic Metabolic Panel: No results for input(s): NA, K, CL, CO2, GLUCOSE, BUN, CREATININE, CALCIUM, MG, PHOS in the last 168 hours.  Liver Function Tests: No results for input(s): AST, ALT, ALKPHOS, BILITOT, PROT, ALBUMIN in the last 168 hours. No results for input(s): LIPASE, AMYLASE in the last 168 hours. No  results for input(s): AMMONIA in the last 168 hours.  CBC: No results for input(s): WBC, NEUTROABS, HGB, HCT, MCV, PLT in the last 168 hours.  Cardiac Enzymes: No results for input(s): CKTOTAL, CKMB, CKMBINDEX, TROPONINI in the last 168 hours.  BNP: Invalid input(s): POCBNP  CBG: No results for input(s): GLUCAP in the last 168 hours.  Microbiology: No results found for this or any previous visit.  Coagulation Studies: No results for input(s): LABPROT, INR in the last 72  hours.  Urinalysis: No results for input(s): COLORURINE, LABSPEC, PHURINE, GLUCOSEU, HGBUR, BILIRUBINUR, KETONESUR, PROTEINUR, UROBILINOGEN, NITRITE, LEUKOCYTESUR in the last 168 hours.  Invalid input(s): APPERANCEUR  Lipid Panel:  No results found for: CHOL, TRIG, HDL, CHOLHDL, VLDL, LDLCALC  HgbA1C: No results found for: HGBA1C  Urine Drug Screen:  No results found for: LABOPIA, COCAINSCRNUR, LABBENZ, AMPHETMU, THCU, LABBARB  Alcohol Level: No results for input(s): ETH in the last 168 hours.  Other results:  Imaging: Ct Head Wo Contrast  08/20/2015  CLINICAL DATA:  Code stroke, facial droop, unable to bear weight on left side EXAM: CT HEAD WITHOUT CONTRAST TECHNIQUE: Contiguous axial images were obtained from the base of the skull through the vertex without intravenous contrast. COMPARISON:  10/03/2014 FINDINGS: No evidence of parenchymal hemorrhage or extra-axial fluid collection. No mass lesion, mass effect, or midline shift. No CT evidence of acute infarction. Extensive subcortical white matter and periventricular small vessel ischemic changes. Intracranial atherosclerosis. Global cortical and central atrophy. Secondary ventricular prominence. The visualized paranasal sinuses are essentially clear. The mastoid air cells are unopacified. No evidence of calvarial fracture. IMPRESSION: No evidence of acute intracranial abnormality. Atrophy with extensive small vessel ischemic changes. These results were called by telephone at the time of interpretation on 08/20/2015 at 5:34 pm to Dr. Hosie Poisson, who verbally acknowledged these results. Electronically Signed   By: Charline Bills M.D.   On: 08/20/2015 17:33    Assessment: 81 y.o. female hx of A fib, s/p PPM, HTN presenting with acute onset of left sided weakness and facial droop. Patient received 1/2 amp of D50 via EMS for initial CBG of 65. Symptoms improved but not back to baseline. NIHSS of 4. Out of tPA window, not a IR candidate. Admit for  stroke workup.    Plan: 1. HgbA1c, fasting lipid panel 2. Unable to get MRI. Repeat head CT in 24hrs 3. PT consult, OT consult, Speech consult 4. Echocardiogram 5. Carotid dopplers 6. Prophylactic therapy - ASA  daily 7. Risk factor modification 8. Telemetry monitoring 9. Frequent neuro checks 10. NPO until RN stroke swallow screen   Elspeth Cho, DO [facility] [phone]  If 7pm- 7am, please page neurology on call as listed in AMION. 08/20/2015, 5:40 PM

## 2015-08-20 NOTE — ED Notes (Signed)
Admitting at bedside 

## 2015-08-20 NOTE — ED Notes (Addendum)
Pt. Presents with onset of stroke like symptoms. Pt. Lives at home with son. Son had lunch with pt. At 1200, that is LKW. When her son got the pt. Downstairs a couple hours later (1600) she had left sided weakness and L facial droop. Pt. Is ambulatory and alert at baseline with hx of slight dementia. Pt. Is AxO x4 at present. Pt. CBG 65 on EMS arrival they gave 1/2 an amp of D 50. CBG 117 now.

## 2015-08-21 ENCOUNTER — Encounter (HOSPITAL_COMMUNITY): Payer: Self-pay | Admitting: Physical Medicine and Rehabilitation

## 2015-08-21 ENCOUNTER — Ambulatory Visit (HOSPITAL_COMMUNITY): Payer: Medicare Other

## 2015-08-21 DIAGNOSIS — M6289 Other specified disorders of muscle: Secondary | ICD-10-CM

## 2015-08-21 LAB — BASIC METABOLIC PANEL
Anion gap: 8 (ref 5–15)
BUN: 7 mg/dL (ref 6–20)
CALCIUM: 9.5 mg/dL (ref 8.9–10.3)
CHLORIDE: 100 mmol/L — AB (ref 101–111)
CO2: 25 mmol/L (ref 22–32)
CREATININE: 0.49 mg/dL (ref 0.44–1.00)
GFR calc Af Amer: >60 mL/min (ref >60)
GFR calc non Af Amer: >60 mL/min (ref >60)
Glucose, Bld: 104 mg/dL — ABNORMAL HIGH (ref 65–99)
Potassium: 3.7 mmol/L (ref 3.5–5.1)
SODIUM: 133 mmol/L — AB (ref 135–145)

## 2015-08-21 LAB — LIPID PANEL
Cholesterol: 253 mg/dL — ABNORMAL HIGH (ref 0–200)
HDL: 88 mg/dL (ref >40)
LDL Cholesterol: 156 mg/dL — ABNORMAL HIGH (ref 0–99)
Total CHOL/HDL Ratio: 2.9 RATIO
Triglycerides: 46 mg/dL (ref <150)
VLDL: 9 mg/dL (ref 0–40)

## 2015-08-21 LAB — CBC
HCT: 41.9 % (ref 36.0–46.0)
Hemoglobin: 14.7 g/dL (ref 12.0–15.0)
MCH: 31.7 pg (ref 26.0–34.0)
MCHC: 35.1 g/dL (ref 30.0–36.0)
MCV: 90.3 fL (ref 78.0–100.0)
PLATELETS: 157 10*3/uL (ref 150–400)
RBC: 4.64 MIL/uL (ref 3.87–5.11)
RDW: 13.9 % (ref 11.5–15.5)
WBC: 6.7 10*3/uL (ref 4.0–10.5)

## 2015-08-21 MED ORDER — PROMETHAZINE HCL 25 MG PO TABS
12.5000 mg | ORAL_TABLET | Freq: Four times a day (QID) | ORAL | Status: DC | PRN
  Administered 2015-08-21: 12.5 mg via ORAL
  Filled 2015-08-21: qty 1

## 2015-08-21 MED ORDER — PROMETHAZINE HCL 25 MG PO TABS: 12.5000 mg | ORAL_TABLET | Freq: Once | ORAL | Status: DC | PRN

## 2015-08-21 NOTE — H&P (Signed)
Date: 08/21/2015               Patient Name:  Kathryn Woodard MRN: [id number]  DOB: 1920/07/12 Age / Sex: 81 y.o., female   PCP: Karie Schwalbelivia Linthavong, MD         Medical Service: Internal Medicine Teaching Service         Attending Physician: Dr. Tyson Aliasuncan Thomas Vincent, MD    First Contact: Dr. Wonda CeriseWilliam, Kennedy, MD Pager: [phone]  Second Contact: Dr. Boykin PeekJacquelyn Gill, MD Pager: [phone]       After Hours (After 5p/  First Contact Pager: [phone]  weekends / holidays): Second Contact Pager: [phone]   Chief Complaint: Weakness   History of Present Illness:  Mr. Payton Mccallumardue is a 81 year old woman with a past medical history of atrial fibrillation (on Pradaxa) and sick sinus syndrome (s/p pacemaker placement), hypothyroidism, vascular dementia, HTN, and previous TIAs who comes to hospital with new onset weakness. She was joined by her son Roque CashBlake Lienhard who relayed most of the history. It started at 4 PM the afternoon of 10/23 after he was helping her out of the tub. It was characterized by left-sided leg weakness, her son describing it as "her leg flopping like a piece of spaghetti," left arm weakness, slurred speech, and right-sided facial droop. The son says that she was articulating coherent words, but that her speech was nonsensical. The patient says she remembers the event, but says it's "sort of hazy." At home, her son reports that she has baseline memory due to her dementia and that she needs help completing nearly all of her ADLs. She is able to walk with a walker, and they go to the park nearly every day. Her son reports that she has not had any fever, chills, headache, loss of consciousness, light-headedness, chest pain, shortness or breath, cough, sore throat, leg swelling, nausea, vomiting, diarrhea, constipation, or rashes. Per her son, her strength and speech had improved since arriving to the ED, but she was not yet at her baseline.  Meds: Current Facility-Administered Medications    Medication Dose Route Frequency Provider Last Rate Last Dose  . dabigatran (PRADAXA) capsule 75 mg  75 mg Oral BID Beather Arbourushil Patel V, MD   75 mg at 08/21/15 0126  . labetalol (NORMODYNE,TRANDATE) injection 10 mg  10 mg Intravenous Once Gavin PoundJustin Brooten, MD   Stopped at 08/20/15 1924  . levothyroxine (SYNTHROID, LEVOTHROID) tablet 75 mcg  75 mcg Oral QAC breakfast Rushil Patel V, MD      . metoprolol (LOPRESSOR) injection 5 mg  5 mg Intravenous Q6H PRN Rushil Terrilee CroakPatel V, MD      . metoprolol succinate (TOPROL-XL) 24 hr tablet 50 mg  50 mg Oral Daily Rushil Patel V, MD      . sertraline (ZOLOFT) tablet 50 mg  50 mg Oral Daily Rushil Patel V, MD      . sodium chloride 0.9 % injection 3 mL  3 mL Intravenous Q12H Rushil Terrilee CroakPatel V, MD   3 mL at 08/21/15 0130    Allergies: Allergies as of 08/20/2015  . (No Known Allergies)   Past Medical History  Diagnosis Date  . Hypertension   . A-fib (HCC)   . Thyroid disease   . Pacemaker    Past Surgical History  Procedure Laterality Date  . Knee surgery     No family history on file. Social History   Social History  . Marital Status: Married    Spouse Name: N/A  .  Number of Children: N/A  . Years of Education: N/A   Occupational History  . Not on file.   Social History Main Topics  . Smoking status: Never Smoker   . Smokeless tobacco: Not on file  . Alcohol Use: No  . Drug Use: No  . Sexual Activity: Not on file   Other Topics Concern  . Not on file   Social History Narrative    Review of Systems: Negative except per HPI.  Physical Exam: Blood pressure 159/69, pulse 64, temperature 97.5 F (36.4 C), temperature source Oral, resp. rate 16, height  (1.651 m), weight 137 lb 9.6 oz (62.415 kg), SpO2 97 %. General: Woman lying in bed, no acute distress HEENT: Non-tender, nodular appearing growths on hard palate, moist mucous membranes, no carotid bruits, no cervical lymphadenopathy Cardiovascular: Regular, rate, and rhythm. No  m/r/g Pulmonary: Clear to auscultation bilaterally Abdomen: Soft, non-tender, non-distended Extremities: No clubbing, cyanosis, or edema. 1+ pulses bilaterally Skin: Warm and dry. No rashes  Neurological: Alert. Oriented person and place, not year. Speech mildly dysarthric, able to repeat "no ifs, ands, or buts." Tongue midline. No uvular deviation. Facial weakness on right with sparing of the forehead. No ophthalmoplegia. No visual field cut.  Pupils appeared constricted but reactive. 5/5 strength in all extremities. DTRs 1+ and symmetric through. Diminished sensation on left upper and lower extremities. Facial sensation normal. Finger-to-nose normal.   Lab results: Basic Metabolic Panel:  Recent Labs  16/10/96 1753 08/20/15 1755  NA 135 135  K 4.2 4.2  CL 99* 97*  CO2 27  --   GLUCOSE 110* 109*  BUN 12 15  CREATININE 0.58 0.60  CALCIUM 9.7  --    Liver Function Tests:  Recent Labs  08/20/15 1753  AST 30  ALT 16  ALKPHOS 41  BILITOT 1.1  PROT 7.6  ALBUMIN 3.8   CBC:  Recent Labs  08/20/15 1753 08/20/15 1755  WBC 4.7  --   NEUTROABS 3.1  --   HGB 14.6 16.7*  HCT 43.5 49.0*  MCV 92.4  --   PLT 144*  --      Recent Labs  08/20/15 1751  GLUCAP 109*     Recent Labs  08/20/15 1753  LABPROT 16.6*  INR 1.33     Imaging results:  Ct Head Wo Contrast  08/20/2015  CLINICAL DATA:  Code stroke, facial droop, unable to bear weight on left side EXAM: CT HEAD WITHOUT CONTRAST TECHNIQUE: Contiguous axial images were obtained from the base of the skull through the vertex without intravenous contrast. COMPARISON:  10/03/2014 FINDINGS: No evidence of parenchymal hemorrhage or extra-axial fluid collection. No mass lesion, mass effect, or midline shift. No CT evidence of acute infarction. Extensive subcortical white matter and periventricular small vessel ischemic changes. Intracranial atherosclerosis. Global cortical and central atrophy. Secondary ventricular  prominence. The visualized paranasal sinuses are essentially clear. The mastoid air cells are unopacified. No evidence of calvarial fracture. IMPRESSION: No evidence of acute intracranial abnormality. Atrophy with extensive small vessel ischemic changes. These results were called by telephone at the time of interpretation on 08/20/2015 at 5:34 pm to Dr. Hosie Poisson, who verbally acknowledged these results. Electronically Signed   By: Charline Bills M.D.   On: 08/20/2015 17:33    Assessment & Plan by Problem:  CVA versus TIA: No evidence of acute infarct on CT, but unable to obtain MRI due to pacemaker placement. She is already anticoagulated for atrial fibrillation, on Pradaxa. 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 Out of tPA window and not a candidate for IR. Utility of echocardiogram would be questionable given she is already on Pradaxa for known Afib. The utility of carotid dopplers is questionable given that she is unlikely to be a candidate for endarterectomy. She has since eaten a sandwich without complication, so no need for NPO at this time. - Repeat head CT - q2 neuro checks - Permissive HTN to 220/110 - Telemetry monitoring  Atrial Fibrillation:  - Continue dabigatran 75 mg BID - Continue metoprolol 50 mg 24 hr tablet  Hypothyroidism: Continue levothyroxine 75 mcg  Depression: Continue Sertraline 50 mg daily  Code Status: Full  Dispo: Disposition is deferred at this time, awaiting improvement of current medical problems. Anticipated discharge in approximately 2-3 day(s).   The patient does have a current PCP (Karie Schwalbe, MD) and does not need an [facility] hospital follow-up appointment after discharge.  The patient does have transportation limitations that hinder transportation to clinic appointments.  Signed: Ruben Im, MD 08/21/2015, 1:38 AM

## 2015-08-21 NOTE — Progress Notes (Signed)
VASCULAR LAB PRELIMINARY  PRELIMINARY  PRELIMINARY  PRELIMINARY  Carotid duplex completed.    Preliminary report:  Bilateral:  1-39% ICA stenosis.  Vertebral artery flow is antegrade.     Eulene Pekar, RVS 08/21/2015, 11:38 AM

## 2015-08-21 NOTE — Progress Notes (Signed)
Subjective: Mrs. Kathryn Woodard is a 81yo F with PMHx of AFib on Pradaxa, sick sinus syndrome s/p pacemaker placement, hypothyroidism, HTN, vascular dementia and previous TIAs who is admitted for a CVA. She had no acute events overnight. She still has some residual weakness on her left side, lower extremity weaker than upper, which is much improved from her initial presentation. She denies any other symptoms at this time.   Objective: Vital signs in last 24 hours: Filed Vitals:   08/21/15 0330 08/21/15 0524 08/21/15 0742 08/21/15 1007  BP: 169/91 155/59 137/83 154/55  Pulse: 73 65 63 64  Temp:    97.6 F (36.4 C)  TempSrc:    Oral  Resp: Height:      Weight:      SpO2: 96% 94% 96% 98%   Weight change:  No intake or output data in the 24 hours ending 08/21/15 1105 BP 154/55 mmHg  Pulse 64  Temp(Src) 97.6 F (36.4 C) (Oral)  Resp 18  Ht  (1.651 m)  Wt 137 lb 9.6 oz (62.415 kg)  BMI 22.90 kg/m2  SpO2 98%  General Appearance:    Alert, cooperative, no distress, appears stated age  Head:    Normocephalic, without obvious abnormality, atraumatic  Eyes:    PERRL, conjunctiva/corneas clear, EOM's intact, fundi    benign, both eyes       Lungs:     Clear to auscultation bilaterally, respirations unlabored  Heart:    Regular rate and rhythm, S1 and S2 normal, no rub or gallop. There is a 2/6 holosystolic murmur heard best at the left sternal border.  Abdomen:     Soft, non-tender, bowel sounds active all four quadrants,    no masses, no organomegaly  Extremities:   Extremities normal, atraumatic, no cyanosis or edema  Pulses:   2+ and symmetric all extremities  Skin:   Skin color, texture, turgor normal, no rashes or lesions  Lymph nodes:   Cervical, supraclavicular, and axillary nodes normal  Neurologic:   CNII-XII intact. Strength in LLE 3/5, RLE 5/5, LUE 4/5, RUE 5/5. Sensation and reflexes are intact.   Lab Results: Basic Metabolic Panel:  Recent Labs Lab  08/20/15 1753 08/20/15 1755 08/21/15 0510  NA 135 135 133*  K 4.2 4.2 3.7  CL 99* 97* 100*  CO2 27  --  25  GLUCOSE 110* 109* 104*  BUN CREATININE 0.58 0.60 0.49  CALCIUM 9.7  --  9.5   Liver Function Tests:  Recent Labs Lab 08/20/15 1753  AST 30  ALT 16  ALKPHOS 41  BILITOT 1.1  PROT 7.6  ALBUMIN 3.8   CBC:  Recent Labs Lab 08/20/15 1753 08/20/15 1755 08/21/15 0510  WBC 4.7  --  6.7  NEUTROABS 3.1  --   --   HGB 14.6 16.7* 14.7  HCT 43.5 49.0* 41.9  MCV 92.4  --  90.3  PLT 144*  --  157   CBG:  Recent Labs Lab 08/20/15 1751  GLUCAP 109*   Fasting Lipid Panel:  Recent Labs Lab 08/21/15 0510  CHOL 253*  HDL 88  LDLCALC 156*  TRIG 46  CHOLHDL 2.9   Coagulation:  Recent Labs Lab 08/20/15 1753  LABPROT 16.6*  INR 1.33   Studies/Results: Ct Head Wo Contrast  08/20/2015  CLINICAL DATA:  Code stroke, facial droop, unable to bear weight on left side EXAM: CT HEAD WITHOUT CONTRAST TECHNIQUE: Contiguous axial images were  obtained from the base of the skull through the vertex without intravenous contrast. COMPARISON:  10/03/2014 FINDINGS: No evidence of parenchymal hemorrhage or extra-axial fluid collection. No mass lesion, mass effect, or midline shift. No CT evidence of acute infarction. Extensive subcortical white matter and periventricular small vessel ischemic changes. Intracranial atherosclerosis. Global cortical and central atrophy. Secondary ventricular prominence. The visualized paranasal sinuses are essentially clear. The mastoid air cells are unopacified. No evidence of calvarial fracture. IMPRESSION: No evidence of acute intracranial abnormality. Atrophy with extensive small vessel ischemic changes. These results were called by telephone at the time of interpretation on 08/20/2015 at 5:34 pm to Dr. Hosie PoissonSumner, who verbally acknowledged these results. Electronically Signed   By: Charline BillsSriyesh  Krishnan M.D.   On: 08/20/2015 17:33    Assessment/Plan: Active Problems:   Left-sided weakness   CVA (cerebral vascular accident) (HCC) 1. CVA - No acute evidence of acute infarct on CT, but unable to obtain MRI due to her pacemaker. From initial exam on admission, her strength is improving, and her facial droop and miotic pupils have improved as well. Patient was not a tPA candidate 2/2 time window, and is already on Pradaxa for her AFib.  -Repeat head CT this afternoon, 24hrs after initial to e/f ischemic changes -Neuro checks -PT/OT/speech to evaluate for post-discharge care needs  Dispo: Disposition is deferred at this time, awaiting improvement of current medical problems.  Anticipated discharge in approximately 1-2 day(s).   The patient does have a current PCP (Karie Schwalbelivia Linthavong, MD) and [facility] after discharge.  The patient does not have transportation limitations that hinder transportation to clinic appointments.   LOS: 1 day   Darrick HuntsmanWilliam R Siniyah Evangelist, MD 08/21/2015, 11:05 AM

## 2015-08-21 NOTE — Progress Notes (Signed)
PT Cancellation Note  Patient Details Name: Kathryn HamMary S Woodard MRN: [id number] DOB: May 17, 1920   Cancelled Treatment:    Reason Eval/Treat Not Completed: Patient at procedure or test/unavailable. Pt currently off unit for testing. Will check back as schedule allows to complete PT eval.    Conni SlipperKirkman, Gurshaan Matsuoka 08/21/2015, 11:15 AM   Conni SlipperLaura Sheetal Lyall, PT, DPT Acute Rehabilitation Services Pager: [phone]

## 2015-08-21 NOTE — Evaluation (Signed)
Physical Therapy Evaluation Patient Details Name: Garey HamMary S Roehrs MRN: [id number] DOB: 03/07/1920 Today's Date: 08/21/2015   History of Present Illness  Pt is a 81 y/o female admitted with new onset L-sided weakness consistent with probable CVA. PMH includes a-fib, dementia, and prior TIA's.  Clinical Impression  Pt admitted with above diagnosis. Pt currently with functional limitations due to the deficits listed below (see PT Problem List). At the time of PT eval pt was able to perform transfers to/from EOB with max to total assist. Daughter present in room and states that PTA pt was managing well at home with son to assist her. Feel this pt could benefit from continued rehab at the CIR level to maximize mobility and return to PLOF. Pt will benefit from skilled PT to increase their independence and safety with mobility to allow discharge to the venue listed below.       Follow Up Recommendations CIR;Supervision/Assistance - 24 hour    Equipment Recommendations  None recommended by PT    Recommendations for Other Services Rehab consult     Precautions / Restrictions Precautions Precautions: Fall Restrictions Weight Bearing Restrictions: No      Mobility  Bed Mobility Overal bed mobility: Needs Assistance Bed Mobility: Rolling;Sidelying to Sit;Sit to Supine Rolling: Mod assist Sidelying to sit: Max assist   Sit to supine: Total assist   General bed mobility comments: Pt required hand-over-hand assist to reach for bed rails. Assist provided for every aspect of bed mobility, and pt with increased extension of trunk which limited pt's ability to sit EOB.   Transfers                 General transfer comment: Unable to progress transfers due to posterior lean  Ambulation/Gait                Stairs            Wheelchair Mobility    Modified Rankin (Stroke Patients Only) Modified Rankin (Stroke Patients Only) Pre-Morbid Rankin Score: Moderate  disability Modified Rankin: Severe disability     Balance Overall balance assessment: Needs assistance Sitting-balance support: Feet supported;Bilateral upper extremity supported Sitting balance-Leahy Scale: Zero Sitting balance - Comments: Pt required max assist to maintain sitting balance at EOB. Able to maintain with mod assist for short bouts before requiring max assist again.  Postural control: Posterior lean                                   Pertinent Vitals/Pain Pain Assessment: No/denies pain    Home Living Family/patient expects to be discharged to:: Private residence Living Arrangements: Children Available Help at Discharge: Family;Available 24 hours/day Type of Home: House Home Access: Stairs to enter   Entergy CorporationEntrance Stairs-Number of Steps: 1 Home Layout: Two level Home Equipment: Emergency planning/management officerhower seat;Walker - 2 wheels;Toilet riser      Prior Function Level of Independence: Needs assistance   Gait / Transfers Assistance Needed: Uses a walker all the time  ADL's / Homemaking Assistance Needed: Son assists with bath - puts her in the tub shower and assists as needed to wash.        Hand Dominance   Dominant Hand: Right    Extremity/Trunk Assessment   Upper Extremity Assessment: Defer to OT evaluation           Lower Extremity Assessment: Generalized weakness      Cervical / Trunk Assessment: Kyphotic  Communication   Communication: HOH (Deaf in L ear per daughter)  Cognition Arousal/Alertness: Awake/alert Behavior During Therapy: WFL for tasks assessed/performed Overall Cognitive Status: History of cognitive impairments - at baseline (Per daughter at baseline)                      General Comments      Exercises        Assessment/Plan    PT Assessment Patient needs continued PT services  PT Diagnosis Difficulty walking;Generalized weakness   PT Problem List Decreased strength;Decreased range of motion;Decreased activity  tolerance;Decreased balance;Decreased mobility;Decreased knowledge of use of DME;Decreased safety awareness;Decreased knowledge of precautions  PT Treatment Interventions DME instruction;Gait training;Therapeutic activities;Stair training;Functional mobility training;Therapeutic exercise;Neuromuscular re-education;Patient/family education   PT Goals (Current goals can be found in the Care Plan section) Acute Rehab PT Goals PT Goal Formulation: With patient/family Time For Goal Achievement: 09/04/15 Potential to Achieve Goals: Fair    Frequency Min 4X/week   Barriers to discharge Inaccessible home environment 16 steps up to bedroom/bathroom    Co-evaluation               End of Session   Activity Tolerance: Patient limited by fatigue Patient left: in bed;with call bell/phone within reach;with bed alarm set;with family/visitor present Nurse Communication: Mobility status;Need for lift equipment         Time: 1404-1440 PT Time Calculation (min) (ACUTE ONLY): 36 min   Charges:   PT Evaluation $Initial PT Evaluation Tier I: 1 Procedure PT Treatments $Therapeutic Activity: 8-22 mins   PT G Codes:        Conni Slipper 09-16-2015, 3:24 PM   Conni Slipper, PT, DPT Acute Rehabilitation Services Pager: [phone]

## 2015-08-21 NOTE — Progress Notes (Signed)
STROKE TEAM PROGRESS NOTE   HISTORY Kathryn Woodard is an 81 y.o. female hx of HTN, A fib, s/p PPM presenting with left sided weakness and facial droop. LKW 1200 on 08/20/2015, around 1600 son noted she was not talking as much and seemed weak on the left side. Upon EMS arrival noted SBP in the 200s. Blood sugar was 65. Given 1/2 amp of D50. Symptoms improved upon arrival to ED but not back to baseline. No prior CVA history.  CT head imaging reviewed, no acute process. Initial NIHSS of 4.  Modified Rankin: Rankin Score=0. Patient was not administered TPA secondary to outside tPA window. She was admitted for further evaluation and treatment.   SUBJECTIVE (INTERVAL HISTORY) Her daughter is at the bedside. Daughter is very concerned and educated about pts condition. Overall she feels her condition is stable.    OBJECTIVE Temp:  [97.5 F (36.4 C)-97.6 F (36.4 C)] 97.6 F (36.4 C) (10/24 1007) Pulse Rate:  [58-91] 64 (10/24 1007) Cardiac Rhythm:  [-] Ventricular paced (10/24 0700) Resp:  [14-25] 18 (10/24 1007) BP: (137-216)/(55-115) 154/55 mmHg (10/24 1007) SpO2:  [93 %-98 %] 98 % (10/24 1007) Weight:  [58.968 kg (130 lb)-62.415 kg (137 lb 9.6 oz)] 62.415 kg (137 lb 9.6 oz) (10/23 2324)  CBC:  Recent Labs Lab 08/20/15 1753 08/20/15 1755 08/21/15 0510  WBC 4.7  --  6.7  NEUTROABS 3.1  --   --   HGB 14.6 16.7* 14.7  HCT 43.5 49.0* 41.9  MCV 92.4  --  90.3  PLT 144*  --  157    Basic Metabolic Panel:  Recent Labs Lab 08/20/15 1753 08/20/15 1755 08/21/15 0510  NA 135 135 133*  K 4.2 4.2 3.7  CL 99* 97* 100*  CO2 27  --  25  GLUCOSE 110* 109* 104*  BUN CREATININE 0.58 0.60 0.49  CALCIUM 9.7  --  9.5    Lipid Panel:    Component Value Date/Time   CHOL 253* 08/21/2015 0510   TRIG 46 08/21/2015 0510   HDL 88 08/21/2015 0510   CHOLHDL 2.9 08/21/2015 0510   VLDL 9 08/21/2015 0510   LDLCALC 156* 08/21/2015 0510   HgbA1c: No results found for: HGBA1C Urine Drug  Screen: No results found for: LABOPIA, COCAINSCRNUR, LABBENZ, AMPHETMU, THCU, LABBARB    IMAGING  Ct Head Wo Contrast  08/20/2015  CLINICAL DATA:  Code stroke, facial droop, unable to bear weight on left side EXAM: CT HEAD WITHOUT CONTRAST TECHNIQUE: Contiguous axial images were obtained from the base of the skull through the vertex without intravenous contrast. COMPARISON:  10/03/2014 FINDINGS: No evidence of parenchymal hemorrhage or extra-axial fluid collection. No mass lesion, mass effect, or midline shift. No CT evidence of acute infarction. Extensive subcortical white matter and periventricular small vessel ischemic changes. Intracranial atherosclerosis. Global cortical and central atrophy. Secondary ventricular prominence. The visualized paranasal sinuses are essentially clear. The mastoid air cells are unopacified. No evidence of calvarial fracture. IMPRESSION: No evidence of acute intracranial abnormality. Atrophy with extensive small vessel ischemic changes. These results were called by telephone at the time of interpretation on 08/20/2015 at 5:34 pm to Dr. Hosie Poisson, who verbally acknowledged these results. Electronically Signed   By: Charline Bills M.D.   On: 08/20/2015 17:33    Carotid Doppler  There is 1-39% bilateral ICA stenosis. Vertebral artery flow is antegrade.     PHYSICAL EXAM Pleasant elderly Caucasian lady not in distress. . Afebrile. Head is  nontraumatic. Neck is supple without bruit.    Cardiac exam no murmur or gallop. Lungs are clear to auscultation. Distal pulses are well felt. Neurological Exam :  Awake alert oriented 3 with normal speech and language function. Diminished attention, registration and recall. No dysarthria or aphasia. Extraocular moments are full range without nystagmus. Fundi were not visualized. Vision acuity seemed adequate. Hearing is decreased significantly on the left. Face is symmetric. Tongue is midline. Motor system exam no upper or lower  extremity drift. Mild weakness of left grip and intrinsic hand muscles. Orbits right over left Middle. Minimum left hip flexor and ankle dorsiflexor weakness. But no drift. Sensation appears preserved bilaterally. Dependent for some 1 symmetric. Plantars are downgoing. Gait was not tested. ASSESSMENT/PLAN Ms. Kathryn Woodard is a 81 y.o. female with history of HTN, A fib, s/p PPM presenting with left sided weakness and facial droop. She did not receive IV t-PA due to delay in arrival.   Stroke:  Non-dominant right brain infarct, most likely  Embolic secondary to known atrial fibrillation while on Pradaxa  Resultant  Left hemiparesis, mild L facial droop  MRI  / MRA  Pacer  Repeat CT pending   Carotid Doppler  No significant stenosis   2D Echo  Not ordered   LDL 156  HgbA1c pending  dabigatran for VTE prophylaxis  Diet Heart Room service appropriate?: Yes; Fluid consistency:: Thin  Pradaxa (dabigatran) twice a day prior to admission, now on Pradaxa (dabigatran) twice a day. No evidence to change NOACs in the setting of acute stroke. As she is tolerating well, recommend continuation at discharge    Patient counseled to be compliant with her antithrombotic medications  Ongoing aggressive stroke risk factor management  Therapy recommendations:  pending   Disposition:  pending  (uses walker at home)  Atrial Fibrillation  Home anticoagulation:  dabigatran continued in the hospital  Continue dabigatran at discharge   Hypertension  Elevated in past 24h, Stable now  Permissive hypertension (OK if < 220/120) but gradually normalize in 5-7 days  Hyperlipidemia  Home meds:  No statin  LDL 156, goal < 70  Recommend addition or statin or contraindication reason  Other Stroke Risk Factors  Advanced age  Hx previous TIAs, no stroke per daughter  Other Active Problems  Thyroid disease  SSS s/p pacemaker  Vascular dementia  Hospital day # 1  BIBY,SHARON  [facility] See Amion for Pager information 08/21/2015 11:58 AM  I have personally examined this patient, reviewed notes, independently viewed imaging studies, participated in medical decision making and plan of care. I have made any additions or clarifications directly to the above note. Agree with note above.  She presented with dysarthria and left facial weakness secondary to right hemispheric TIA versus small infarct not visualized on initial CT. She remains at risk for neurological worsening, recurrent stroke, TIA needs ongoing stroke evaluation and aggressive risk factor modification. I also had a long discussion with the patient and daughter with regards to lack of any head to head comparative trials between the new anticoagulants and lack of data suggesting whether staying on Pradaxa is better than changing  to eliquis or Xarelto in her situation. I recommend continue Pradaxa for now Delia HeadyPramod Diany Formosa, MD Medical Director [facility] Pager: [phone] 08/21/2015 4:52 PM    To contact Stroke Continuity provider, please refer to [email or web address]. After hours, contact General Neurology

## 2015-08-21 NOTE — Progress Notes (Signed)
Pt arrived to unit via stretcher with son and daughter at bedside.  Pt alert and oriented to unit, staff and plan of care.  Vitals and assessment stable, see flow sheets.  Tele applied and central monitoring notified.  Bed low, bed alarm on and call bell within reach.

## 2015-08-21 NOTE — Consult Note (Signed)
Physical Medicine and Rehabilitation Consult  Reason for Consult: Left sided weakness and left facial droop.  Referring Physician: Dr. Oswaldo Done    HPI: Kathryn Woodard is a 81 y.o. female with history of HTN, A Fib-on pradaxa , SSS-PPM, dementia;  who was admitted on 08/20/15 with acute onset of left sided weakness and left facial weakness. CT head revealed global cortical and central atrophy with extensive SVD and no acute changes.  Carotid dopplers without significant ICA stenosis. Work up ongoing and Dr. Pearlean Brownie recommends continuing Pradaxa bid most likely embolic stroke in setting of A fib.  PT evaluation done and CIR recommended for follow up therapy.  Discussed with son who feels 3hrs/day of therapy is too much for his mother and would like to see how see progresses over the next few days.  He would also like her to be at or closer to his home in [place], [place] and receive therapies there.   Review of Systems  Unable to perform ROS: medical condition    Past Medical History  Diagnosis Date  . Hypertension   . A-fib (HCC)   . Thyroid disease   . Pacemaker     for SSS    Past Surgical History  Procedure Laterality Date  . Knee surgery      No family history on file.    Social History:  Widowed.  Was a homemaker. She lives with her son and needed assistance with ADLs.  Ambulated with walker and required assistance in am and at times during the day. Per  reports that she has never smoked. She does not have any smokeless tobacco history on file. Per report, she has 1-2 ounces of wine daily. She does not use illicit drugs.   Allergies: No Known Allergies    Medications Prior to Admission  Medication Sig Dispense Refill  . Calcium Carbonate-Vitamin D 600-400 MG-UNIT tablet Take 2 tablets by mouth daily.    . dabigatran (PRADAXA) 75 MG CAPS capsule Take 75 mg by mouth 2 (two) times daily.    Marland Kitchen levothyroxine (SYNTHROID, LEVOTHROID) 75 MCG tablet Take 75 mcg by mouth every  morning.  0  . metoprolol succinate (TOPROL-XL) 50 MG 24 hr tablet Take 50 mg by mouth daily.  5  . sertraline (ZOLOFT) 100 MG tablet Take 50 mg by mouth daily.  0  . Vitamin D, Ergocalciferol, (DRISDOL) 50000 UNITS CAPS capsule Take 1 capsule by mouth every Saturday.  2    Home: Home Living Family/patient expects to be discharged to:: Private residence Living Arrangements: Children Available Help at Discharge: Family, Available 24 hours/day Type of Home: House Home Access: Stairs to enter [employer] of Steps: 1 Home Layout: Two level Alternate Level Stairs-Number of Steps: 16 Alternate Level Stairs-Rails: Right, Left, Can reach both Bathroom Shower/Tub: Engineer, manufacturing systems: Standard Bathroom Accessibility: Yes Home Equipment: Information systems manager, Environmental consultant - 2 wheels, Toilet riser  Functional History: Prior Function Level of Independence: Needs assistance Gait / Transfers Assistance Needed: Uses a walker all the time ADL's / Homemaking Assistance Needed: Son assists with bath - puts her in the tub shower and assists as needed to wash; max A for bathing/dressing; setup and extra time for eating  Comments: baseline dementia impacting levels of assist Functional Status:  Mobility: Bed Mobility Overal bed mobility: Needs Assistance Bed Mobility: Rolling, Sidelying to Sit, Sit to Supine Rolling: Mod assist Sidelying to sit: Max assist Sit to supine: Total assist General bed mobility comments:  Pt required hand-over-hand assist to reach for bed rails. Assist provided for every aspect of bed mobility, and pt with increased extension of trunk which limited pt's ability to sit EOB.  Transfers General transfer comment: Unable to progress transfers due to posterior lean      ADL:    Cognition: Cognition Overall Cognitive Status: History of cognitive impairments - at baseline (Per daughter at baseline) Orientation Level: Oriented to person, Oriented to place, Oriented  to time, Oriented to situation Cognition Arousal/Alertness: Awake/alert Behavior During Therapy: WFL for tasks assessed/performed Overall Cognitive Status: History of cognitive impairments - at baseline (Per daughter at baseline)  Blood pressure 143/62, pulse 63, temperature 97.7 F (36.5 C), temperature source Axillary, resp. rate 16, height 5\' 5"  (1.651 m), weight 62.415 kg (137 lb 9.6 oz), SpO2 96 %. Physical Exam  Nursing note and vitals reviewed. Constitutional: She appears well-developed.  Frail appearing, elderly female  HENT:  Head: Normocephalic and atraumatic.  Mouth/Throat: Oropharynx is clear and moist.  Eyes: Conjunctivae and EOM are normal. Pupils are equal, round, and reactive to light.  Neck: Normal range of motion. Neck supple.  Cardiovascular: Normal rate.  An irregular rhythm present.  Murmur heard. Respiratory: Effort normal and breath sounds normal. No respiratory distress. She has no wheezes.  GI: Soft. Bowel sounds are normal. She exhibits no distension. There is no tenderness.  Musculoskeletal: She exhibits no edema or tenderness.  Well healed old scar left knee.  Pt uncooperative with exam  Neurological: She is alert. She has normal reflexes.  Lethargic A&O to person only. HOH with soft voice.  Slow to process and reporting fatigue. Poor postural reflexes and leaning to the left.   Neg Homan's  Skin: Skin is warm and dry. No rash noted.  Psychiatric: Her speech is delayed. She is slowed.    No results found for this or any previous visit (from the past 24 hour(s)). Ct Head Wo Contrast  08/22/2015  CLINICAL DATA:  81 y.o. female hx of HTN, A fib, s/p PPM presenting with left sided weakness and facial droop. LKN 1200 on 08/20/2015 EXAM: CT HEAD WITHOUT CONTRAST TECHNIQUE: Contiguous axial images were obtained from the base of the skull through the vertex without intravenous contrast. COMPARISON:  CT head 08/20/2015. FINDINGS: Advanced atrophy with chronic  microvascular ischemic change is stable. No visible acute stroke or hemorrhage. No mass lesion or extra-axial fluid. Hydrocephalus ex vacuo. Calvarium intact. Vascular calcification without signs of large vessel occlusion. Extracranial soft tissues unremarkable. IMPRESSION: Stable exam. Atrophy and small vessel disease without visible acute intracranial findings. Electronically Signed   By: Elsie StainJohn T Curnes M.D.   On: 08/22/2015 07:39   Ct Head Wo Contrast  08/20/2015  CLINICAL DATA:  Code stroke, facial droop, unable to bear weight on left side EXAM: CT HEAD WITHOUT CONTRAST TECHNIQUE: Contiguous axial images were obtained from the base of the skull through the vertex without intravenous contrast. COMPARISON:  10/03/2014 FINDINGS: No evidence of parenchymal hemorrhage or extra-axial fluid collection. No mass lesion, mass effect, or midline shift. No CT evidence of acute infarction. Extensive subcortical white matter and periventricular small vessel ischemic changes. Intracranial atherosclerosis. Global cortical and central atrophy. Secondary ventricular prominence. The visualized paranasal sinuses are essentially clear. The mastoid air cells are unopacified. No evidence of calvarial fracture. IMPRESSION: No evidence of acute intracranial abnormality. Atrophy with extensive small vessel ischemic changes. These results were called by telephone at the time of interpretation on 08/20/2015 at 5:34 pm to Dr. Hosie PoissonSumner,  who verbally acknowledged these results. Electronically Signed   By: Charline Bills M.D.   On: 08/20/2015 17:33    Assessment/Plan: Diagnosis: Presumed embolic infarct Labs and images independently reviewed.  Records reviewed and summated above.  1. Does the need for close, 24 hr/day medical supervision in concert with the patient's rehab needs make it unreasonable for this patient to be served in a less intensive setting? Yes  2. Co-Morbidities requiring supervision/potential complications: HTN  (monitor and provide prns in accordance with increased physical exertion and pain), A Fib (Echo pending, Cont meds) , hypothyroidism, dementia 3. Due to bladder management, bowel management, safety, disease management, medication administration and patient education, does the patient require 24 hr/day rehab nursing? Yes 4. Does the patient require coordinated care of a physician, rehab nurse, PT (1-2 hrs/day, 5 days/week), OT (1-2 hrs/day, 5 days/week) and SLP (1-2 hrs/day, 5 days/week) to address physical and functional deficits in the context of the above medical diagnosis(es)? Yes Addressing deficits in the following areas: balance, endurance, locomotion, strength, transferring, bowel/bladder control, bathing, dressing, feeding, grooming, toileting, cognition, speech, language, swallowing and psychosocial support 5. Can the patient actively participate in an intensive therapy program of at least 3 hrs of therapy per day at least 5 days per week? Potentially 6. The potential for patient to make measurable gains while on inpatient rehab is fair and poor 7. Anticipated functional outcomes upon discharge from inpatient rehab are mod assist and max assist  with PT, mod assist and max assist with OT, mod assist and max assist with SLP. 8. Estimated rehab length of stay to reach the above functional goals is: 10-21 days. 9. Does the patient have adequate social supports and living environment to accommodate these discharge functional goals? Yes 10. Anticipated D/C setting: Home 11. Anticipated post D/C treatments: HH therapy and Outpatient therapy 12. Overall Rehab/Functional Prognosis: fair and poor  RECOMMENDATIONS: This patient's condition is appropriate for continued rehabilitative care in the following setting: Home with nursing (Son's preference) vs. SNF near [place].  Further, pt appears to be close to her baseline level of functioning per son. Patient has agreed to participate in recommended  program. Potentially Note that insurance prior authorization may be required for reimbursement for recommended care.   Maryla Morrow, MD 08/22/2015

## 2015-08-21 NOTE — Progress Notes (Signed)
Rehab Admissions Coordinator Note:  Patient was screened by Trish MageLogue, Quetzaly Ebner M for appropriateness for an Inpatient Acute Rehab Consult.  At this time, we are recommending Inpatient Rehab consult.  Trish MageLogue, Delanie Tirrell M 08/21/2015, 3:59 PM  I can be reached at [phone].

## 2015-08-22 ENCOUNTER — Inpatient Hospital Stay (HOSPITAL_COMMUNITY): Payer: Medicare Other

## 2015-08-22 DIAGNOSIS — E785 Hyperlipidemia, unspecified: Secondary | ICD-10-CM | POA: Insufficient documentation

## 2015-08-22 DIAGNOSIS — I4891 Unspecified atrial fibrillation: Secondary | ICD-10-CM

## 2015-08-22 DIAGNOSIS — I482 Chronic atrial fibrillation, unspecified: Secondary | ICD-10-CM | POA: Insufficient documentation

## 2015-08-22 DIAGNOSIS — I6340 Cerebral infarction due to embolism of unspecified cerebral artery: Secondary | ICD-10-CM | POA: Insufficient documentation

## 2015-08-22 DIAGNOSIS — I6789 Other cerebrovascular disease: Secondary | ICD-10-CM

## 2015-08-22 DIAGNOSIS — F0390 Unspecified dementia without behavioral disturbance: Secondary | ICD-10-CM

## 2015-08-22 DIAGNOSIS — I10 Essential (primary) hypertension: Secondary | ICD-10-CM

## 2015-08-22 DIAGNOSIS — Z7901 Long term (current) use of anticoagulants: Secondary | ICD-10-CM | POA: Insufficient documentation

## 2015-08-22 DIAGNOSIS — I639 Cerebral infarction, unspecified: Secondary | ICD-10-CM

## 2015-08-22 LAB — HEMOGLOBIN A1C
HEMOGLOBIN A1C: 5.8 % — AB (ref 4.8–5.6)
Mean Plasma Glucose: 120 mg/dL

## 2015-08-22 MED ORDER — KCL IN DEXTROSE-NACL 10-5-0.45 MEQ/L-%-% IV SOLN
INTRAVENOUS | Status: DC
  Administered 2015-08-22: 15:00:00 via INTRAVENOUS
  Filled 2015-08-22 (×2): qty 1000

## 2015-08-22 MED ORDER — KCL IN DEXTROSE-NACL 10-5-0.45 MEQ/L-%-% IV SOLN
INTRAVENOUS | Status: DC
  Filled 2015-08-22 (×2): qty 1000

## 2015-08-22 MED ORDER — KCL IN DEXTROSE-NACL 10-5-0.45 MEQ/L-%-% IV SOLN
INTRAVENOUS | Status: DC
  Administered 2015-08-22: 21:00:00 via INTRAVENOUS
  Filled 2015-08-22 (×3): qty 1000

## 2015-08-22 MED ORDER — PROMETHAZINE HCL 12.5 MG PO TABS
6.2500 mg | ORAL_TABLET | Freq: Four times a day (QID) | ORAL | Status: DC | PRN
  Filled 2015-08-22: qty 1

## 2015-08-22 MED ORDER — PRAVASTATIN SODIUM 40 MG PO TABS
40.0000 mg | ORAL_TABLET | Freq: Every day | ORAL | Status: DC
Start: 2015-08-22 — End: 2015-08-22

## 2015-08-22 NOTE — Progress Notes (Signed)
Subjective: Kathryn Woodard is a 81yo F with PMHx of AFib on Pradaxa, sick sinus syndrome s/p pacemaker placement, hypothyroidism, HTN, vascular dementia and previous TIAs who is admitted for a CVA. She had no acute events overnight. She did have some nausea so phenergan 12.5mg  was given with much improvement. She still has significant LLE weakness this morning, LUE weakness is improving. She has no other issues at this time.  Objective: Vital signs in last 24 hours: Filed Vitals:   08/21/15 2038 08/22/15 0158 08/22/15 0546 08/22/15 1027  BP: 146/89 134/63 143/62 119/51  Pulse: 60 66 63 73  Temp: 98.4 F (36.9 C) 97.5 F (36.4 C) 97.7 F (36.5 C) 98.4 F (36.9 C)  TempSrc: Oral Axillary Axillary Axillary  Resp: Height:      Weight:      SpO2: 93% 97% 96% 96%   Weight change:   Intake/Output Summary (Last 24 hours) at 08/22/15 1158 Last data filed at 08/22/15 1026  Gross per 24 hour  Intake    580 ml  Output      0 ml  Net    580 ml   BP 119/51 mmHg  Pulse 73  Temp(Src) 98.4 F (36.9 C) (Axillary)  Resp 18  Ht  (1.651 m)  Wt 137 lb 9.6 oz (62.415 kg)  BMI 22.90 kg/m2  SpO2 96%  General Appearance:    Alert, cooperative, no distress, appears stated age  Head:    Normocephalic, without obvious abnormality, atraumatic  Eyes:    PERRL, conjunctiva/corneas clear, EOM's intact, fundi    benign, both eyes       Lungs:     Clear to auscultation bilaterally, respirations unlabored  Heart:    Regular rate and rhythm, S1 and S2 normal, no rub or gallop. There is a 2/6 holosystolic murmur heard best at the left sternal border.  Abdomen:     Soft, non-tender, bowel sounds active all four quadrants,    no masses, no organomegaly  Extremities:   Extremities normal, atraumatic, no cyanosis or edema  Pulses:   2+ and symmetric all extremities  Skin:   Skin color, texture, turgor normal, no rashes or lesions  Lymph nodes:   Cervical, supraclavicular, and axillary  nodes normal  Neurologic:   CNII-XII intact. Strength in LLE 3/5, RLE 5/5, LUE 4/5, RUE 5/5. Sensation and reflexes are intact. Patient is somnolent, decreased attention and distractible.   Lab Results: Basic Metabolic Panel:  Recent Labs Lab 08/20/15 1753 08/20/15 1755 08/21/15 0510  NA 135 135 133*  K 4.2 4.2 3.7  CL 99* 97* 100*  CO2 27  --  25  GLUCOSE 110* 109* 104*  BUN CREATININE 0.58 0.60 0.49  CALCIUM 9.7  --  9.5   Liver Function Tests:  Recent Labs Lab 08/20/15 1753  AST 30  ALT 16  ALKPHOS 41  BILITOT 1.1  PROT 7.6  ALBUMIN 3.8   CBC:  Recent Labs Lab 08/20/15 1753 08/20/15 1755 08/21/15 0510  WBC 4.7  --  6.7  NEUTROABS 3.1  --   --   HGB 14.6 16.7* 14.7  HCT 43.5 49.0* 41.9  MCV 92.4  --  90.3  PLT 144*  --  157   CBG:  Recent Labs Lab 08/20/15 1751  GLUCAP 109*   Fasting Lipid Panel:  Recent Labs Lab 08/21/15 0510  CHOL 253*  HDL 88  LDLCALC 156*  TRIG 46  CHOLHDL  2.9   Coagulation:  Recent Labs Lab 08/20/15 1753  LABPROT 16.6*  INR 1.33   Studies/Results: Ct Head Wo Contrast  08/22/2015  CLINICAL DATA:  81 y.o. female hx of HTN, A fib, s/p PPM presenting with left sided weakness and facial droop. LKN 1200 on 08/20/2015 EXAM: CT HEAD WITHOUT CONTRAST TECHNIQUE: Contiguous axial images were obtained from the base of the skull through the vertex without intravenous contrast. COMPARISON:  CT head 08/20/2015. FINDINGS: Advanced atrophy with chronic microvascular ischemic change is stable. No visible acute stroke or hemorrhage. No mass lesion or extra-axial fluid. Hydrocephalus ex vacuo. Calvarium intact. Vascular calcification without signs of large vessel occlusion. Extracranial soft tissues unremarkable. IMPRESSION: Stable exam. Atrophy and small vessel disease without visible acute intracranial findings. Electronically Signed   By: Elsie StainJohn T Curnes M.D.   On: 08/22/2015 07:39   Ct Head Wo Contrast  08/20/2015  CLINICAL  DATA:  Code stroke, facial droop, unable to bear weight on left side EXAM: CT HEAD WITHOUT CONTRAST TECHNIQUE: Contiguous axial images were obtained from the base of the skull through the vertex without intravenous contrast. COMPARISON:  10/03/2014 FINDINGS: No evidence of parenchymal hemorrhage or extra-axial fluid collection. No mass lesion, mass effect, or midline shift. No CT evidence of acute infarction. Extensive subcortical white matter and periventricular small vessel ischemic changes. Intracranial atherosclerosis. Global cortical and central atrophy. Secondary ventricular prominence. The visualized paranasal sinuses are essentially clear. The mastoid air cells are unopacified. No evidence of calvarial fracture. IMPRESSION: No evidence of acute intracranial abnormality. Atrophy with extensive small vessel ischemic changes. These results were called by telephone at the time of interpretation on 08/20/2015 at 5:34 pm to Dr. Hosie PoissonSumner, who verbally acknowledged these results. Electronically Signed   By: Charline BillsSriyesh  Krishnan M.D.   On: 08/20/2015 17:33   Assessment/Plan: Active Problems:   Left-sided weakness   CVA (cerebral vascular accident) (HCC) 1. CVA - No acute evidence of acute infarct on CT, but unable to obtain MRI due to her pacemaker. From initial exam on admission, her strength is improving, and her facial droop and miotic pupils have improved as well. Patient was not a tPA candidate 2/2 time window, and is already on Pradaxa for her AFib.  -Repeat head CT showed no new ischemic changes, stable from initial head CT -Following PT/OT/speech recs -Statin was offered to the family for possible secondary stroke prevention this morning - however, after discussion with her son, they do not want to start the medication at this time after discussing the risks and benefits of initiating -Consult social work to assist with facilitating transfer to home/home healthcare -Have dc'ed telemetry and frequency of  vitals/neuro checks/interventions to minimize agitation as patient is at a high risk for delirium  Dispo: Disposition is deferred at this time, awaiting improvement of current medical problems.  Anticipated discharge in approximately 1 day(s).   The patient does have a current PCP (Karie Schwalbelivia Linthavong, MD) and [facility] after discharge.  The patient does not have transportation limitations that hinder transportation to clinic appointments.   LOS: 2 days   Darrick HuntsmanWilliam R Daymian Lill, MD 08/22/2015, 11:58 AM

## 2015-08-22 NOTE — Care Management Note (Signed)
Case Management Note  Patient Details  Name: Kathryn Woodard MRN: [id number] Date of Birth: 03/27/1920  Subjective/Objective:                    Action/Plan: Patient admitted with Lt sided weakness. PT recommending CIR. Awaiting CIR recommendation. CM will continue to follow for discharge needs.  Expected Discharge Date:                  Expected Discharge Plan:  IP Rehab Facility  In-House Referral:     Discharge planning Services     Post Acute Care Choice:    Choice offered to:     DME Arranged:    DME Agency:     HH Arranged:    HH Agency:     Status of Service:  In process, will continue to follow  Medicare Important Message Given:    Date Medicare IM Given:    Medicare IM give by:    Date Additional Medicare IM Given:    Additional Medicare Important Message give by:     If discussed at Long Length of Stay Meetings, dates discussed:    Additional Comments:  Kermit BaloKelli F Oceanna Arruda, RN 08/22/2015, 10:15 AM

## 2015-08-22 NOTE — Progress Notes (Signed)
Pt unable to ambulate today, unable to tolerate sitting up on side of bed.

## 2015-08-22 NOTE — Progress Notes (Signed)
STROKE TEAM PROGRESS NOTE   SUBJECTIVE (INTERVAL HISTORY) Her daughter and son are at Kathryn bedside. Pt sleepy but arousable, orientated. Family [facility] and potentially palliative care.    OBJECTIVE Temp:  [97.5 F (36.4 C)-98.8 F (37.1 C)] 98.4 F (36.9 C) (10/25 1027) Pulse Rate:  [60-73] 73 (10/25 1027) Cardiac Rhythm:  [-] Ventricular paced (10/25 0700) Resp:  [16-18] 18 (10/25 1027) BP: (119-174)/(51-89) 119/51 mmHg (10/25 1027) SpO2:  [93 %-97 %] 96 % (10/25 1027)  CBC:   Recent Labs Lab 08/20/15 1753 08/20/15 1755 08/21/15 0510  WBC 4.7  --  6.7  NEUTROABS 3.1  --   --   HGB 14.6 16.7* 14.7  HCT 43.5 49.0* 41.9  MCV 92.4  --  90.3  PLT 144*  --  157   Basic Metabolic Panel:   Recent Labs Lab 08/20/15 1753 08/20/15 1755 08/21/15 0510  NA 135 135 133*  K 4.2 4.2 3.7  CL 99* 97* 100*  CO2 27  --  25  GLUCOSE 110* 109* 104*  BUN 12 15 7   CREATININE 0.58 0.60 0.49  CALCIUM 9.7  --  9.5   Lipid Panel:     Component Value Date/Time   CHOL 253* 08/21/2015 0510   TRIG 46 08/21/2015 0510   HDL 88 08/21/2015 0510   CHOLHDL 2.9 08/21/2015 0510   VLDL 9 08/21/2015 0510   LDLCALC 156* 08/21/2015 0510   HgbA1c:  Lab Results  Component Value Date   HGBA1C 5.8* 08/21/2015    IMAGING I have personally reviewed Kathryn radiological images below and agree with Kathryn radiology interpretations.  Ct Head Wo Contrast 08/22/2015   Stable exam. Atrophy and small vessel disease without visible acute intracranial findings.   Ct Head Wo Contrast 08/20/2015  No evidence of acute intracranial abnormality. Atrophy with extensive small vessel ischemic changes.   Carotid Doppler  There is 1-39% bilateral ICA stenosis. Vertebral artery flow is antegrade.    2D echo - - Left ventricle: Kathryn cavity size was normal. Wall thickness was increased in a pattern of moderate LVH. Systolic function was normal. Kathryn estimated ejection fraction was in  Kathryn range of 60% to 65%. Wall motion was normal; there were no regional wall motion abnormalities. - Mitral valve: Mildly calcified annulus. Mildly thickened leaflets . There was mild regurgitation. - Left atrium: Kathryn atrium was mildly dilated. - Right ventricle: Kathryn cavity size was mildly dilated. - Right atrium: Kathryn atrium was severely dilated. - Tricuspid valve: There was severe regurgitation. - Pulmonary arteries: Systolic pressure was moderately to severely increased. PA peak pressure: 66 mm Hg (S).  PHYSICAL EXAM  Temp:  [97.4 F (36.3 C)-98.4 F (36.9 C)] 97.4 F (36.3 C) (10/25 1436) Pulse Rate:  [60-73] 60 (10/25 1436) Resp:  [16-18] 18 (10/25 1027) BP: (107-154)/(43-89) 107/43 mmHg (10/25 1436) SpO2:  [88 %-97 %] 88 % (10/25 1436)  General - thin built, well developed, sleepy but arousable, snores at sleep.  Ophthalmologic - Fundi not visualized due to noncooperation.  Cardiovascular - irregularly irregular heart rate and rhythm.  Mental Status -  Sleepy but arousable, orientated to place, time and person, but dysarthria and hypophonic. Language including expression, naming, repetition, comprehension was assessed and found intact.  Cranial Nerves II - XII - II - Visual field intact OU. III, IV, VI - Extraocular movements intact. V - Facial sensation intact bilaterally. VII - left facial droop VIII - Hearing & vestibular intact  bilaterally. X - Palate elevates symmetrically. XI - Chin turning & shoulder shrug intact bilaterally. XII - Tongue protrusion intact.  Motor Woodard - Kathryn Woodard was 2/5 LUE and 3/5 LLE, baseline 3-/5 RLE and 4/5 RUE and pronator drift was absent.  Bulk was decreased and fasciculations were absent.   Motor Tone - Muscle tone was assessed at Kathryn neck and appendages and was decreased at LUE.  Reflexes - Kathryn patient's reflexes were 1+ in all extremities and she had no pathological reflexes.  Sensory - Light touch,  temperature/pinprick were assessed and were symmetrical.    Coordination - Kathryn patient had normal movements in Kathryn right hand with no ataxia or dysmetria.  Tremor was absent.  Gait and Station - not tested due to weakness   ASSESSMENT/PLAN Kathryn Woodard is a 81 y.o. female with history of HTN, A fib, s/p PPM presenting with left sided weakness and facial droop. She did not receive IV t-PA due to delay in arrival.   Stroke:  Likely right subcortical infarct, most likely  embolic secondary to known atrial fibrillation while on Pradaxa vs. Small vessel disease due to risk factor of age, HTN, HLD  Resultant  Left hemiparesis, mild L facial droop  MRI  / MRA  Pacer  Repeat CT with no significant findings - indicating small size of stroke  Carotid Doppler  No significant stenosis   2D Echo  unremarkable   LDL 156  HgbA1c 5.8  dabigatran for VTE prophylaxis Diet Heart Room service appropriate?: Yes; Fluid consistency:: Thin  Pradaxa (dabigatran) twice a day prior to admission, now on Pradaxa (dabigatran) twice a day. No evidence to change NOACs in Kathryn setting of acute stroke. As she is tolerating well, recommend continuation at discharge    Patient counseled to be compliant with her antithrombotic medications  Ongoing aggressive stroke risk factor management  Therapy recommendations:  CIR. Consult in place. Son does not feel it is realistic as she was unable to tolerate 3 hr/therapy a day 3 years ago.   Disposition:  pending  (lives with son, uses walker at home. They would like to take her home with [facility], which sounds reasonable. They are talking with [facility] in [place] if Kathryn home situation does not work out. Reports Kathryn Dr. Oswaldo Done is looking into palliative care - we discussed with son at bedside and agrees with plan).  Atrial Fibrillation  Home anticoagulation:  dabigatran continued in Kathryn hospital  Continue dabigatran at discharge   Hypertension  Elevated in past  24h, Stable now  Permissive hypertension (OK if < 220/120) but gradually normalize in 5-7 days  Hyperlipidemia  Home meds:  No statin  LDL 156, goal < 70  Put on pravastatin, but discontinued by primary team.  Other Stroke Risk Factors  Advanced age  Hx previous TIAs, no stroke per daughter  Other Active Problems  Thyroid disease  SSS s/p pacemaker  Vascular dementia  Hospital day # 2   Neurology will sign off. Please call with questions. No neurology follow up needed at this time. Thanks for Kathryn consult.   Marvel Plan, MD PhD Stroke Neurology 08/22/2015 4:33 PM    To contact Stroke Continuity provider, please refer to [email or web address]. After hours, contact General Neurology

## 2015-08-22 NOTE — Progress Notes (Signed)
SLP Cancellation Note  Patient Details Name: Garey HamMary S Dinapoli MRN: [id number] DOB: 02/02/1920   Cancelled treatment:       Reason Eval/Treat Not Completed: Fatigue/lethargy limiting ability to participate.  Son requested SLP let patient sleep and return at a later time.   Fae PippinMelissa Candra Wegner, M.A., CCC-SLP [phone]  Jehieli Brassell 08/22/2015, 11:44 AM

## 2015-08-22 NOTE — Care Management Note (Signed)
Case Management Note  Patient Details  Name: Kathryn Woodard MRN: [id number] Date of Birth: 04-Aug-1920  Subjective/Objective:                    Action/Plan: Patient did not qualify for CIR. Patients family wants to take the patient home with home health services. MD placed order for hospital bed, however upon talking with the family they do not feel they need this currently and state they have most of the equipment needed at home already. Patient's son says they could use a wheelchair but he wants to obtain one from an outside vendor. CM met with the son and provided him a list of home health agencies in the [place] area. He chose [facility]. Miranda with [facility] notified and accepted the referral. Patients son also expressed interest in having home oxygen to help with his mothers comfort. CM informed him that patients have to qualify for home oxygen for insurance to cover it. Patient's son wants to have home oxygen even if Medicare will not cover the cost. Bedside RN going to test to see how Ms Sammons does saturation wise without oxygen to see if she qualifies, but MD notified of request and ordered home oxygen either way. CM spoke with Jermaine with [facility] about the oxygen and he states it may cost them about $350-400/ month for the oxygen if it is private pay. CM informed the son and he is in agreement to have oxygen at home. Jermaine notified and is going to have oxygen set up at Ms Ringel's home. Patients son also is asking to have ambulance transportation home. Marjorie Smolder with [facility] notified and will arrange transportation.   Expected Discharge Date:                  Expected Discharge Plan:  [place]  In-House Referral:  Clinical Social Work (transportation)  Discharge planning Services  CM Consult  Post Acute Care Choice:  Durable Medical Equipment Choice offered to:  Adult Children  DME Arranged:  Oxygen, Gel overlay, Hospital bed DME Agency:   [place] Arranged:  RN, PT, OT, Nurse's Aide, Social Work [employer] Agency:  [facility]  Status of Service:  Completed, signed off  Medicare Important Message Given:    Date Medicare IM Given:    Medicare IM give by:    Date Additional Medicare IM Given:    Additional Medicare Important Message give by:     If discussed at [place] of Stay Meetings, dates discussed:    Additional Comments:  Pollie Friar, RN 08/22/2015, 2:14 PM

## 2015-08-22 NOTE — Progress Notes (Signed)
Spoke with Dr. Ladona Ridgelaylor, internal medicine. No new orders.  Hold d/c for now until pt seen by MD.

## 2015-08-22 NOTE — Progress Notes (Signed)
Per Dr. Delane GingerGill, hold discharge until tmw. Restart fluids and monitor bp. Family at bedside.

## 2015-08-22 NOTE — Evaluation (Addendum)
Speech Language Pathology Evaluation Patient Details Name: Kathryn Woodard MRN: [id number] DOB: April 14, 1920 Today's Date: 08/22/2015 Time: 1345-1400 SLP Time Calculation (min) (ACUTE ONLY): 15 min  Problem List:  Patient Active Problem List   Diagnosis Date Noted  . Left-sided weakness 08/20/2015  . CVA (cerebral vascular accident) (HCC) 08/20/2015   Past Medical History:  Past Medical History  Diagnosis Date  . Hypertension   . A-fib (HCC)   . Thyroid disease   . Pacemaker     for SSS   Past Surgical History:  Past Surgical History  Procedure Laterality Date  . Knee surgery     HPI:  Mrs. Kathryn Woodard is a 81yo F with PMHx of AFib on Pradaxa, sick sinus syndrome s/p pacemaker placement, hypothyroidism, HTN, vascular dementia and previous TIAs who is admitted for CVA work-up.      Assessment / Plan / Recommendation Clinical Impression  Orders were received for bedside swallow and cogntiive-linguistic evaluations.  However, bedside evaluation deferred due to RN confirming that patient passed the RN stroke swallow screen.   Speech evaluation complete.  Patient's son and daughter present and report that at baseline patient had cognitive deficits.  Speech intelligibility most marked impairment at this time and as a result, was the focus of the evaluation.  Patient demonstrates dysphonic speech at the word level; however, cues for deep breath prior to onset of phonation resulted in improved, audible vocal intensity.  Patient and family acknowledge this as an impairment; however, want to decline SLP services at this time pending discharge home, stabilization, and overall progress with PT/mobility.  Son educated on need for him to seek out a referral for SLP services post discharge and he verbalized understanding.  SLP signing off at this time.      SLP Assessment  All further Speech Lanaguage Pathology  needs can be addressed in the next venue of care (when family and patient are ready)     Follow Up Recommendations  24 hour supervision/assistance;Other (comment) (Home health services when patient and son are ready)            Pertinent Vitals/Pain Pain Assessment: No/denies pain Faces Pain Scale: No hurt   SLP Goals  Progression toward goals:  (Eval ) Patient/Family Stated Goal: to go home   SLP Evaluation Prior Functioning  Cognitive/Linguistic Baseline: Baseline deficits Baseline deficit details: 24/7 with son, oriented to self  Type of Home: House  Lives With: Family Available Help at Discharge: Family;Available 24 hours/day Vocation: Retired   IT consultantCognition  Overall Cognitive Status: History of cognitive impairments - at baseline    Comprehension  Auditory Comprehension Overall Auditory Comprehension: Impaired at baseline    Expression Expression Primary Mode of Expression: Verbal Verbal Expression Overall Verbal Expression: Appears within functional limits for tasks assessed   Oral / Motor Oral Motor/Sensory Function Overall Oral Motor/Sensory Function: Impaired (difficulty with following commands; suspect left sided weakness ) Motor Speech Overall Motor Speech: Impaired Respiration: Impaired Level of Impairment: Word Phonation: Breathy;Low vocal intensity;Other (comment) (dysphonic whisper) Intelligibility: Intelligibility reduced Word: 25-49% accurate Phrase: 0-24% accurate Sentence: 0-24% accurate Motor Planning: Witnin functional limits Interfering Components: Hearing loss Effective Techniques: Increased vocal intensity (cues for deep breath prior to onset of speech )   GO    Kathryn PippinMelissa Kalene Woodard, M.A., CCC-SLP [phone]  Bart Ashford 08/22/2015, 2:23 PM

## 2015-08-22 NOTE — Progress Notes (Signed)
Physical Therapy Treatment Patient Details Name: Kathryn Woodard MRN: [id number] DOB: 27-Apr-1920 Today's Date: 08/22/2015    History of Present Illness Pt is a 81 y/o female admitted with new onset L-sided weakness consistent with probable CVA. PMH includes a-fib, dementia, and prior TIA's.    PT Comments    Pt progressing slowly towards physical therapy goals. Mobility was limited to EOB today, with brief stand achieved when provided +2 total assistance. Discussed discharge planning with son who was present at the beginning and end of session. Feel that continued therapy at the SNF level may be the most appropriate at this time. If pt/family declines SNF, recommend a hospital bed and hoyer lift for home. Will continue to follow and progress as able per POC.   Follow Up Recommendations  SNF;Supervision/Assistance - 24 hour     Equipment Recommendations  None recommended by PT    Recommendations for Other Services       Precautions / Restrictions Precautions Precautions: Fall Restrictions Weight Bearing Restrictions: No    Mobility  Bed Mobility Overal bed mobility: Needs Assistance Bed Mobility: Rolling;Sidelying to Sit;Sit to Sidelying Rolling: Max assist Sidelying to sit: Total assist     Sit to sidelying: Total assist General bed mobility comments: Pt required hand-over-hand assist to reach for bed rails. Assist provided for every aspect of bed mobility, and pt with increased extension of trunk which limited pt's ability to sit EOB initially.  Transfers Overall transfer level: Needs assistance Equipment used: 2 person hand held assist Transfers: Sit to/from Stand Sit to Stand: Total assist;+2 physical assistance         General transfer comment: Attempted standing EOB with +2 assist and use of bed pad for increased support under hips. Pt with minimal quad initiation to assist in standing noted.   Ambulation/Gait                 Stairs             Wheelchair Mobility    Modified Rankin (Stroke Patients Only) Modified Rankin (Stroke Patients Only) Pre-Morbid Rankin Score: Moderate disability Modified Rankin: Severe disability     Balance Overall balance assessment: Needs assistance Sitting-balance support: Bilateral upper extremity supported;Feet supported Sitting balance-Leahy Scale: Zero Sitting balance - Comments: Pt was able to sit without assist for brief periods. Noted some active trunk movement during reaching activity. Grossly required at least mod-max assist most of the time for sitting balance.  Postural control: Posterior lean Standing balance support: Bilateral upper extremity supported;During functional activity Standing balance-Leahy Scale: Zero                      Cognition Arousal/Alertness: Lethargic Behavior During Therapy: Flat affect Overall Cognitive Status: History of cognitive impairments - at baseline                      Exercises      General Comments        Pertinent Vitals/Woodard Woodard Assessment: No/denies Woodard Faces Woodard Scale: No hurt    Home Living     Available Help at Discharge: Family;Available 24 hours/day Type of Home: House              Prior Function            PT Goals (current goals can now be found in the care plan section) Acute Rehab PT Goals PT Goal Formulation: With patient/family Time For Goal Achievement: 09/04/15 Potential to  Achieve Goals: Fair Progress towards PT goals: Progressing toward goals    Frequency  Min 3X/week    PT Plan Discharge plan needs to be updated    Co-evaluation             End of Session Equipment Utilized During Treatment: Gait belt Activity Tolerance: Patient limited by fatigue;Patient limited by lethargy Patient left: in bed;with call bell/phone within reach;with family/visitor present;Other (comment) (MD team)     Time: 8119-1478: 1021-1044 PT Time Calculation (min) (ACUTE ONLY): 23 min  Charges:   $Therapeutic Activity: 23-37 mins                    G Codes:      Kathryn Woodard, Kathryn Woodard 08/22/2015, 2:19 PM   Kathryn Woodard, PT, DPT Acute Rehabilitation Services Pager: [phone]

## 2015-08-22 NOTE — Progress Notes (Signed)
Internal Medicine Attending:   I saw and examined the patient. I reviewed the resident's note and I agree with the resident's findings and plan as documented in the resident's note.  Kathryn Woodard appears to be slipping into mild hypoactive delirium today, she is less interactive than yesterday, distractible, and quickly falls to sleep. PT unable to do much work with her today, she sat to the side in bed and then layed back down because of weakness. It is not clear if she will be able to complete the CIR regimen. The son tells us that he will be able to take her home and provide 24 hour care and supervision if she is not CIR ready. If that is the dispo, he will likely need more equipment like a hospital bed downstairs. Medical management currently seems to be adequate with dabigatran for further stroke prophylaxis. I offered the family a statin, but because there is likely limited benefit in a 81 year old woman having embolic strokes, they decided to decline in order to reduce pill burden and side-effects. We will continue careful delirium precautions.

## 2015-08-22 NOTE — Progress Notes (Signed)
pts bp remains low.  Manual pressures 96 systolic/ 40's to 60's. Paging md regarding discharge. Family at bedside with concerns regarding bp.

## 2015-08-22 NOTE — Progress Notes (Signed)
Occupational Therapy Evaluation Patient Details Name: Kathryn HamMary S Kopka MRN: [id number] DOB: 12-19-1919 Today's Date: 08/22/2015    History of Present Illness Pt is a 81 y/o female admitted with new onset L-sided weakness consistent with probable CVA. PMH includes a-fib, dementia, and prior TIA's.   Clinical Impression   Pt admitted with the above diagnoses and presents with below problem list. Pt will benefit from continued acute OT to address the below listed deficits and maximize independence with BADLs prior to d/c to venue below. PTA pt used a walker at min guard level, son assisted with shower transfers and pt was max A for bathing/dressing; setup and extra time for eating. Pt is currently bed level for most ADLs due to zero sitting balance upon evaluation. Session details below. Son present for session and is live-in caregiver. OT to continue to follow acutely.      Follow Up Recommendations  CIR;Supervision/Assistance - 24 hour    Equipment Recommendations  Other (comment) (defer to next venue)    Recommendations for Other Services Rehab consult     Precautions / Restrictions Precautions Precautions: Fall Restrictions Weight Bearing Restrictions: No      Mobility Bed Mobility Overal bed mobility: Needs Assistance Bed Mobility: Supine to Sit;Sit to Supine Rolling: Max assist   Supine to sit: Total assist Sit to supine: Total assist   General bed mobility comments: assist for all aspects of bed mobility.   Transfers                 General transfer comment: not attempted due to poor sitting balance and posterior lean. Pt with difficulty following commands.     Balance Overall balance assessment: Needs assistance Sitting-balance support: Bilateral upper extremity supported;Feet supported Sitting balance-Leahy Scale: Zero Sitting balance - Comments: max to total assist to sit EOB 5 minutes. BUE placed in supportive position.  Postural control: Posterior  lean                                  ADL Overall ADL's : Needs assistance/impaired Eating/Feeding: Bed level;Moderate assistance   Grooming: Total assistance;Bed level   Upper Body Bathing: Total assistance;Bed level   Lower Body Bathing: Total assistance;Bed level   Upper Body Dressing : Total assistance;Sitting   Lower Body Dressing: Bed level;Total assistance   Toilet Transfer: Total assistance   Toileting- Clothing Manipulation and Hygiene: Total assistance   Tub/ Shower Transfer: Total assistance   Functional mobility during ADLs:  (not attempted due to zero sitting balance) General ADL Comments: Pt needing assist for all aspects of bed mobility. Sat EOB with BUE placed in supportive position with max to total A for 5 minutes.      Vision Additional Comments: difficult to assess due to impaired cognition   Perception     Praxis      Pertinent Vitals/Pain Pain Assessment: Faces Faces Pain Scale: No hurt     Hand Dominance Right   Extremity/Trunk Assessment Upper Extremity Assessment Upper Extremity Assessment: Generalized weakness   Lower Extremity Assessment Lower Extremity Assessment: Defer to PT evaluation   Cervical / Trunk Assessment Cervical / Trunk Assessment: Kyphotic   Communication Communication Communication: HOH (Deaf in L ear per daughter)   Cognition Arousal/Alertness: Awake/alert Behavior During Therapy: Flat affect Overall Cognitive Status: History of cognitive impairments - at baseline  General Comments       Exercises       Shoulder Instructions      Home Living Family/patient expects to be discharged to:: Private residence Living Arrangements: Children Available Help at Discharge: Family;Available 24 hours/day Type of Home: House Home Access: Stairs to enter [employer] of Steps: 1   Home Layout: Two level Alternate Level Stairs-Number of Steps: 16 Alternate Level  Stairs-Rails: Right;Left;Can reach both Bathroom Shower/Tub: Chief Strategy Officer: Standard Bathroom Accessibility: Yes   Home Equipment: Emergency planning/management officer - 2 wheels;Toilet riser          Prior Functioning/Environment Level of Independence: Needs assistance  Gait / Transfers Assistance Needed: Uses a walker all the time ADL's / Homemaking Assistance Needed: Son assists with bath - puts her in the tub shower and assists as needed to wash; max A for bathing/dressing; setup and extra time for eating    Comments: baseline dementia impacting levels of assist    OT Diagnosis: Generalized weakness;Cognitive deficits;Paresis   OT Problem List: Decreased strength;Decreased range of motion;Decreased activity tolerance;Impaired balance (sitting and/or standing);Decreased coordination;Decreased knowledge of use of DME or AE;Decreased knowledge of precautions;Impaired UE functional use   OT Treatment/Interventions: Self-care/ADL training;Therapeutic exercise;Neuromuscular education;Energy conservation;DME and/or AE instruction;Therapeutic activities;Patient/family education;Balance training    OT Goals(Current goals can be found in the care plan section) Acute Rehab OT Goals OT Goal Formulation: With patient/family Time For Goal Achievement: 09/05/15 Potential to Achieve Goals: Good ADL Goals Pt Will Perform Eating: with set-up;sitting Pt Will Perform Grooming: with min assist;sitting;bed level Pt Will Transfer to Toilet: with mod assist;stand pivot transfer;bedside commode Additional ADL Goal #1: Pt will sit EOB with min A for 5 minutes to complete simple ADL task.   OT Frequency: Min 2X/week   Barriers to D/C:            Co-evaluation              End of Session Equipment Utilized During Treatment: Oxygen  Activity Tolerance: Patient limited by fatigue Patient left: in bed;with call bell/phone within reach;with bed alarm set;with family/visitor present    Time: 4098-1191 OT Time Calculation (min): 25 min Charges:  OT General Charges $OT Visit: 1 Procedure OT Evaluation $Initial OT Evaluation Tier I: 1 Procedure OT Treatments $Self Care/Home Management : 8-22 mins G-Codes:    Pilar Grammes 09-07-2015, 10:11 AM

## 2015-08-22 NOTE — Progress Notes (Signed)
Noted family preference to take pt home and provide her care. [phone]

## 2015-08-22 NOTE — Progress Notes (Signed)
  Echocardiogram 2D Echocardiogram has been performed.  Leta JunglingCooper, Janet Humphreys M 08/22/2015, 10:14 AM

## 2015-08-22 NOTE — Progress Notes (Signed)
SATURATION QUALIFICATIONS: (This note is used to comply with regulatory documentation for home oxygen)  Patient Saturations on Room Air at Rest = 88%  Patient Saturations on 2 Liters of oxygen while on Room Air while at rest = 92-94%

## 2015-08-23 ENCOUNTER — Inpatient Hospital Stay (HOSPITAL_COMMUNITY): Payer: Medicare Other

## 2015-08-23 LAB — URINALYSIS, ROUTINE W REFLEX MICROSCOPIC
Bilirubin Urine: NEGATIVE
Glucose, UA: NEGATIVE mg/dL
HGB URINE DIPSTICK: NEGATIVE
Ketones, ur: NEGATIVE mg/dL
Leukocytes, UA: NEGATIVE
Nitrite: NEGATIVE
PROTEIN: NEGATIVE mg/dL
Specific Gravity, Urine: 1.021 (ref 1.005–1.030)
UROBILINOGEN UA: 1 mg/dL (ref 0.0–1.0)
pH: 6 (ref 5.0–8.0)

## 2015-08-23 LAB — URINE CULTURE

## 2015-08-23 MED ORDER — ACETAMINOPHEN 325 MG PO TABS
650.0000 mg | ORAL_TABLET | Freq: Four times a day (QID) | ORAL | Status: DC | PRN
  Administered 2015-08-23: 650 mg via ORAL
  Filled 2015-08-23: qty 2

## 2015-08-23 NOTE — Discharge Instructions (Signed)

## 2015-08-23 NOTE — Discharge Summary (Signed)
Name: Kathryn Woodard MRN: [id number] DOB: 05-08-1920 81 y.o. PCP: Kathryn Schwalbe, MD  Date of Admission: 08/20/2015  5:22 PM Date of Discharge: 08/23/2015 Attending Physician: Kathryn Alias, MD  Discharge Diagnosis: 1. CVA with left-sided weakness  Active Problems:   Left-sided weakness   CVA (cerebral vascular accident) (HCC)   Cerebrovascular accident (CVA) due to embolism of cerebral artery (HCC)   HLD (hyperlipidemia)   Chronic atrial fibrillation (HCC)   Chronic anticoagulation  Discharge Medications:   Medication List    STOP taking these medications        metoprolol succinate 50 MG 24 hr tablet  Commonly known as:  TOPROL-XL      TAKE these medications        Calcium Carbonate-Vitamin D 600-400 MG-UNIT tablet  Take 2 tablets by mouth daily.     levothyroxine 75 MCG tablet  Commonly known as:  SYNTHROID, LEVOTHROID  Take 75 mcg by mouth every morning.     PRADAXA 75 MG Caps capsule  Generic drug:  dabigatran  Take 75 mg by mouth 2 (two) times daily.     sertraline 100 MG tablet  Commonly known as:  ZOLOFT  Take 50 mg by mouth daily.     Vitamin D (Ergocalciferol) 50000 UNITS Caps capsule  Commonly known as:  DRISDOL  Take 1 capsule by mouth every Saturday.        Disposition and follow-up:   Ms.Kathryn Woodard was discharged from [facility] in Stable condition.  At the hospital follow up visit please address:  1.  Improvement in L-sided weakness, level of activity/ADLs, respiratory status  2.  Labs / imaging needed at time of follow-up: None  3.  Pending labs/ test needing follow-up: None  Follow-up Appointments: Follow-up Information    Follow up with Kathryn Schwalbe, MD.   Specialty:  Pediatrics   Why:  called number that is listed and Dr. Burnadette Pop is no longer at that this practice.   [address]:   [address] [place] [address] [phone]       Discharge Instructions: Discharge  Instructions    Diet - low sodium heart healthy    Complete by:  As directed      Diet - low sodium heart healthy    Complete by:  As directed      Increase activity slowly    Complete by:  As directed      Increase activity slowly    Complete by:  As directed            Consultations:    Procedures Performed:  Dg Chest 2 View  08/23/2015  CLINICAL DATA:  Hypoxia, history of hypertension, history of atrial fibrillation. EXAM: CHEST  2 VIEW COMPARISON:  04/15/2015 FINDINGS: Single lead cardiac pacemaker is unchanged. Cardiomediastinal silhouette is enlarged. Mediastinal contours appear intact. There is no evidence of pneumothorax. There is right pleural effusion with associated right lower lobe atelectasis, stable. There is a smaller left pleural effusion. Osseous structures are without acute abnormality. Soft tissues are grossly normal. IMPRESSION: Enlarged cardiac silhouette, stable. Persistent moderate in size right pleural effusion with right lower lobe atelectasis, and smaller left pleural effusion. Electronically Signed   By: Kathryn Woodard M.D.   On: 08/23/2015 09:14   Ct Head Wo Contrast  08/22/2015  CLINICAL DATA:  81 y.o. female hx of HTN, A fib, s/p PPM presenting with left sided weakness and facial droop. LKN 1200 on 08/20/2015 EXAM:  CT HEAD WITHOUT CONTRAST TECHNIQUE: Contiguous axial images were obtained from the base of the skull through the vertex without intravenous contrast. COMPARISON:  CT head 08/20/2015. FINDINGS: Advanced atrophy with chronic microvascular ischemic change is stable. No visible acute stroke or hemorrhage. No mass lesion or extra-axial fluid. Hydrocephalus ex vacuo. Calvarium intact. Vascular calcification without signs of large vessel occlusion. Extracranial soft tissues unremarkable. IMPRESSION: Stable exam. Atrophy and small vessel disease without visible acute intracranial findings. Electronically Signed   By: Kathryn Woodard M.D.   On: 08/22/2015  07:39   Ct Head Wo Contrast  08/20/2015  CLINICAL DATA:  Code stroke, facial droop, unable to bear weight on left side EXAM: CT HEAD WITHOUT CONTRAST TECHNIQUE: Contiguous axial images were obtained from the base of the skull through the vertex without intravenous contrast. COMPARISON:  10/03/2014 FINDINGS: No evidence of parenchymal hemorrhage or extra-axial fluid collection. No mass lesion, mass effect, or midline shift. No CT evidence of acute infarction. Extensive subcortical white matter and periventricular small vessel ischemic changes. Intracranial atherosclerosis. Global cortical and central atrophy. Secondary ventricular prominence. The visualized paranasal sinuses are essentially clear. The mastoid air cells are unopacified. No evidence of calvarial fracture. IMPRESSION: No evidence of acute intracranial abnormality. Atrophy with extensive small vessel ischemic changes. These results were called by telephone at the time of interpretation on 08/20/2015 at 5:34 pm to Kathryn Woodard, who verbally acknowledged these results. Electronically Signed   By: Kathryn Woodard M.D.   On: 08/20/2015 17:33    Admission HPI: Mr. Kathryn Woodard is a 81 year old woman with a past medical history of atrial fibrillation (on Pradaxa) and sick sinus syndrome (s/p pacemaker placement), hypothyroidism, vascular dementia, HTN, and previous TIAs who comes to hospital with new onset weakness. She was joined by her son Kathryn Woodard who relayed most of the history. It started at 4 PM the afternoon of 10/23 after he was helping her out of the tub. It was characterized by left-sided leg weakness, her son describing it as "her leg flopping like a piece of spaghetti," left arm weakness, slurred speech, and right-sided facial droop. The son says that she was articulating coherent words, but that her speech was nonsensical. The patient says she remembers the event, but says it's "sort of hazy." At home, her son reports that she has baseline  memory due to her dementia and that she needs help completing nearly all of her ADLs. She is able to walk with a walker, and they go to the park nearly every day. Her son reports that she has not had any fever, chills, headache, loss of consciousness, light-headedness, chest pain, shortness or breath, cough, sore throat, leg swelling, nausea, vomiting, diarrhea, constipation, or rashes. Per her son, her strength and speech had improved since arriving to the ED, but she was not yet at her baseline.  Hospital Course by problem list: Active Problems:   Left-sided weakness   CVA (cerebral vascular accident) [facility](HCC)   Cerebrovascular accident (CVA) due to embolism of cerebral artery (HCC)   HLD (hyperlipidemia)   Chronic atrial fibrillation (HCC)   Chronic anticoagulation   1. Patient presented with left-sided weakness. On admission, there was no evidence of acute infarct on initial CT. She was unable to obtain MRI due to her pacemaker. From initial exam on admission, her strength did improve somewhat, and her facial droop and miotic pupils resolved. Patient was not a tPA candidate 2/2 time window, and was already on Pradaxa for her AFib and compliant,  so no further medications were given. She still had residual weakness of her left side, LLE worse than LUE. She also had one episode of asymptomatic hypotension (90s/50s) which resolved with restarting her maintenance IVFs (no bolus) and discontinuing her home metoprolol. She did have O2 sats in the high 80s/low 90s on room air, and improved with 2L O2 without any respiratory symptoms. A CXR showed a stable R pleural effusion and some interval atelectatic changes from a CXR back in 03/2015. She had no other issues in the hospital, and was deemed stable for discharge home with arrangement for home health care as well as supplemental oxygen with no other new /medications interventions.  Discharge Vitals:   BP 128/71 mmHg  Pulse 64  Temp(Src) 98.8 F (37.1 C)  (Oral)  Resp 18  Ht  (1.651 m)  Wt 137 lb 9.6 oz (62.415 kg)  BMI 22.90 kg/m2  SpO2 97%  Discharge Labs:  Results for orders placed or performed during the hospital encounter of 08/20/15 (from the past 24 hour(s))  Culture, Urine     Status: None (Preliminary result)   Collection Time: 08/22/15  8:21 PM  Result Value Ref Range   Specimen Description URINE, RANDOM    Special Requests NONE    Culture TOO YOUNG TO READ    Report Status PENDING   Urinalysis, Routine w reflex microscopic (not at [facility])     Status: Abnormal   Collection Time: 08/23/15  5:52 AM  Result Value Ref Range   Color, Urine AMBER (A) YELLOW   APPearance CLOUDY (A) CLEAR   Specific Gravity, Urine 1.021 1.005 - 1.030   pH 6.0 5.0 - 8.0   Glucose, UA NEGATIVE NEGATIVE mg/dL   Hgb urine dipstick NEGATIVE NEGATIVE   Bilirubin Urine NEGATIVE NEGATIVE   Ketones, ur NEGATIVE NEGATIVE mg/dL   Protein, ur NEGATIVE NEGATIVE mg/dL   Urobilinogen, UA 1.0 0.0 - 1.0 mg/dL   Nitrite NEGATIVE NEGATIVE   Leukocytes, UA NEGATIVE NEGATIVE    Signed: Darrick Huntsman, MD 08/23/2015, 11:55 AM

## 2015-08-23 NOTE — Progress Notes (Signed)
Initial Nutrition Assessment  DOCUMENTATION CODES:   Non-severe (moderate) malnutrition in context of chronic illness  INTERVENTION:  Provide Ensure Enlive po once daily, each supplement provides 350 kcal and 20 grams of protein   NUTRITION DIAGNOSIS:   Inadequate oral intake related to poor appetite, acute illness as evidenced by per patient/family report, meal completion < 50%.   GOAL:   Patient will meet greater than or equal to 90% of their needs   MONITOR:   PO intake, Labs, Weight trends, Skin  REASON FOR ASSESSMENT:   Consult Assessment of nutrition requirement/status  ASSESSMENT:   81yo F with PMHx of AFib on Pradaxa, sick sinus syndrome s/p pacemaker placement, hypothyroidism, HTN, vascular dementia and previous TIAs who is admitted for a CVA.  Per patient's son at bedside, pt was active PTA, had a good appetite and was eating well. Since admission patient has been eating less, about 50% of meals. Son reports using Ensure nutritional supplements at home on occasion when patient is sick and eating poorly. Pt agreeable to receiving chocolate Ensure today. Son denies any weight loss in pt, stating she usually weighs 130 lbs. Per physical exam, pt has moderate fat wasting in arms, and moderate muscle wasting of calves, hands, and clavicles/acromion region.   Labs: low sodium  Diet Order:  Diet Heart Room service appropriate?: Yes; Fluid consistency:: Thin Diet - low sodium heart healthy Diet - low sodium heart healthy  Skin:  Reviewed, no issues  Last BM:  10/23  Height:   Ht Readings from Last 1 Encounters:  08/20/15 5\' 5"  (1.651 m)    Weight:   Wt Readings from Last 1 Encounters:  08/20/15 137 lb 9.6 oz (62.415 kg)    Ideal Body Weight:  56.8 kg  BMI:  Body mass index is 22.9 kg/(m^2).  Estimated Nutritional Needs:   Kcal:  1500-1700  Protein:  70-80 grams  Fluid:  1.5-1.7 L/day  EDUCATION NEEDS:   No education needs identified at this  time  Kathryn Ogleeanne Azrielle Woodard RD, LDN Inpatient Clinical Dietitian Pager: [phone] After Hours Pager: [phone]

## 2015-08-23 NOTE — Care Management Important Message (Signed)
Important Message  Patient Details  Name: [name] MRN: [id number] Date of Birth: [birth year]   Medicare Important Message Given:  Yes-second notification given    Orson AloeMegan P Dailan Pfalzgraf 08/23/2015, 12:44 PM

## 2015-08-23 NOTE — Progress Notes (Signed)
Subjective: Kathryn Woodard is a 81yo F with PMHx of AFib on Pradaxa, sick sinus syndrome s/p pacemaker placement, hypothyroidism, HTN, vascular dementia and previous TIAs who is admitted for a CVA. She did have an episode of asymptomatic hypotension yesterday evening before discharge, so she was restarted on IV fluids and her metoprolol was d/c'ed - her pressures are much improved and back to baseline since then. She is much more awake and conversational today. Her weakness is still present and stable as compared to yesterday.   Objective: Vital signs in last 24 hours: Filed Vitals:   08/22/15 2136 08/23/15 0129 08/23/15 0539 08/23/15 0947  BP: 120/66 113/50 135/66 128/71  Pulse: 60 63 80 64  Temp: 97.8 F (36.6 C) 97.6 F (36.4 C) 98 F (36.7 C) 98.8 F (37.1 C)  TempSrc: Oral Axillary Oral Oral  Resp: 16 16 18 18   Height:      Weight:      SpO2: 99% 100% 98% 97%   Weight change:   Intake/Output Summary (Last 24 hours) at 08/23/15 1058 Last data filed at 08/22/15 1800  Gross per 24 hour  Intake    300 ml  Output      0 ml  Net    300 ml   BP 128/71 mmHg  Pulse 64  Temp(Src) 98.8 F (37.1 C) (Oral)  Resp 18  Ht 5\' 5"  (1.651 m)  Wt 137 lb 9.6 oz (62.415 kg)  BMI 22.90 kg/m2  SpO2 97%  General Appearance:    Alert, cooperative, no distress, appears stated age  Head:    Normocephalic, without obvious abnormality, atraumatic  Eyes:    PERRL, conjunctiva/corneas clear, EOM's intact, fundi    benign, both eyes       Lungs:     Clear to auscultation bilaterally, respirations unlabored  Heart:    Regular rate and rhythm, S1 and S2 normal, no rub or gallop. There is a 2/6 holosystolic murmur heard best at the left sternal border.  Abdomen:     Soft, non-tender, bowel sounds active all four quadrants,    no masses, no organomegaly  Extremities:   Extremities normal, atraumatic, no cyanosis or edema  Pulses:   2+ and symmetric all extremities  Skin:   Skin color, texture,  turgor normal, no rashes or lesions  Lymph nodes:   Cervical, supraclavicular, and axillary nodes normal  Neurologic:   CNII-XII intact. Strength in LLE 3/5, RLE 5/5, LUE 4/5, RUE 5/5. Sensation and reflexes are intact. Patient is somnolent, decreased attention and distractible.   Lab Results: Basic Metabolic Panel:  Recent Labs Lab 08/20/15 1753 08/20/15 1755 08/21/15 0510  NA 135 135 133*  K 4.2 4.2 3.7  CL 99* 97* 100*  CO2 27  --  25  GLUCOSE 110* 109* 104*  BUN 12 15 7   CREATININE 0.58 0.60 0.49  CALCIUM 9.7  --  9.5   Liver Function Tests:  Recent Labs Lab 08/20/15 1753  AST 30  ALT 16  ALKPHOS 41  BILITOT 1.1  PROT 7.6  ALBUMIN 3.8   CBC:  Recent Labs Lab 08/20/15 1753 08/20/15 1755 08/21/15 0510  WBC 4.7  --  6.7  NEUTROABS 3.1  --   --   HGB 14.6 16.7* 14.7  HCT 43.5 49.0* 41.9  MCV 92.4  --  90.3  PLT 144*  --  157   CBG:  Recent Labs Lab 08/20/15 1751  GLUCAP 109*   Fasting Lipid Panel:  Recent Labs  Lab 08/21/15 0510  CHOL 253*  HDL 88  LDLCALC 156*  TRIG 46  CHOLHDL 2.9   Coagulation:  Recent Labs Lab 08/20/15 1753  LABPROT 16.6*  INR 1.33   Studies/Results: Dg Chest 2 View  08/23/2015  CLINICAL DATA:  Hypoxia, history of hypertension, history of atrial fibrillation. EXAM: CHEST  2 VIEW COMPARISON:  04/15/2015 FINDINGS: Single lead cardiac pacemaker is unchanged. Cardiomediastinal silhouette is enlarged. Mediastinal contours appear intact. There is no evidence of pneumothorax. There is right pleural effusion with associated right lower lobe atelectasis, stable. There is a smaller left pleural effusion. Osseous structures are without acute abnormality. Soft tissues are grossly normal. IMPRESSION: Enlarged cardiac silhouette, stable. Persistent moderate in size right pleural effusion with right lower lobe atelectasis, and smaller left pleural effusion. Electronically Signed   By: Ted Mcalpine M.D.   On: 08/23/2015 09:14    Ct Head Wo Contrast  08/22/2015  CLINICAL DATA:  81 y.o. female hx of HTN, A fib, s/p PPM presenting with left sided weakness and facial droop. LKN 1200 on 08/20/2015 EXAM: CT HEAD WITHOUT CONTRAST TECHNIQUE: Contiguous axial images were obtained from the base of the skull through the vertex without intravenous contrast. COMPARISON:  CT head 08/20/2015. FINDINGS: Advanced atrophy with chronic microvascular ischemic change is stable. No visible acute stroke or hemorrhage. No mass lesion or extra-axial fluid. Hydrocephalus ex vacuo. Calvarium intact. Vascular calcification without signs of large vessel occlusion. Extracranial soft tissues unremarkable. IMPRESSION: Stable exam. Atrophy and small vessel disease without visible acute intracranial findings. Electronically Signed   By: Elsie Stain M.D.   On: 08/22/2015 07:39   Assessment/Plan: Active Problems:   Left-sided weakness   CVA (cerebral vascular accident) (HCC)   Cerebrovascular accident (CVA) due to embolism of cerebral artery (HCC)   HLD (hyperlipidemia)   Chronic atrial fibrillation (HCC)   Chronic anticoagulation 1. CVA - No acute evidence of acute infarct on CT, but unable to obtain MRI due to her pacemaker. From initial exam on admission, her strength is improving, and her facial droop and miotic pupils have improved as well. Patient was not a tPA candidate 2/2 time window, and is already on Pradaxa for her AFib.  -Repeat head CT showed no new ischemic changes, stable from initial head CT -CXR this morning showed stable R sided pleural effusion with interval atelectatic changes as compared to previous CXR in 03/2015. -Following PT/OT/speech recs -Statin was offered to the family for possible secondary stroke prevention this morning - however, after discussion with her son, they do not want to start the medication at this time after discussing the risks and benefits of initiating -Will continue to hold metoprolol and advised family  (who takes care of her at home) to hold this medication at home at least short term until follow-up with PCP can be arranged since she was hypotensive  Dispo: Disposition is deferred at this time, awaiting improvement of current medical problems.  Anticipated discharge today.  The patient does have a current PCP (Karie Schwalbe, MD) and does need an [facility] hospital follow-up appointment after discharge.  The patient does not have transportation limitations that hinder transportation to clinic appointments.   LOS: 3 days   Darrick Huntsman, MD 08/23/2015, 10:58 AM

## 2015-10-29 DEATH — deceased

## 2016-12-01 IMAGING — CT CT HEAD W/O CM
2 series · 16 of 30 positions shown, 20 images · non-contrast
Comparison: CT head 08/20/2015.

CLINICAL DATA: 89 y.o. female hx of HTN, A fib, s/p PPM presenting
with left sided weakness and facial droop. LKN 2122 on 08/20/2015

EXAM:
CT HEAD WITHOUT CONTRAST
TECHNIQUE: Contiguous axial images were obtained from the base of the skull
through the vertex without intravenous contrast.

[Series 201: head w/o, idose (1) · axial · non-contrast · 0.49mm/px · z∈[+58,+183]mm · 13 of 31 slices shown, 17 images]
[im 3/31  brain]
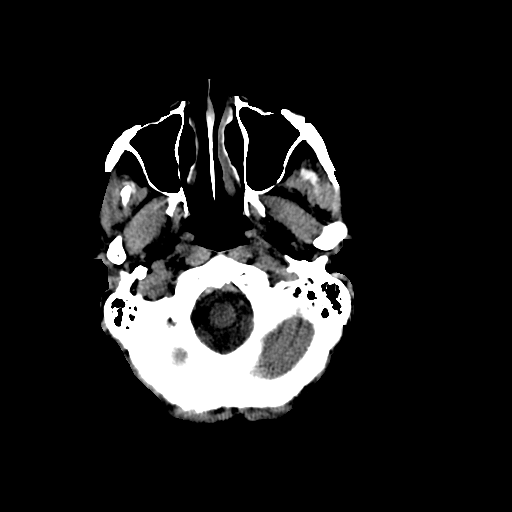
[im 3/31  bone]
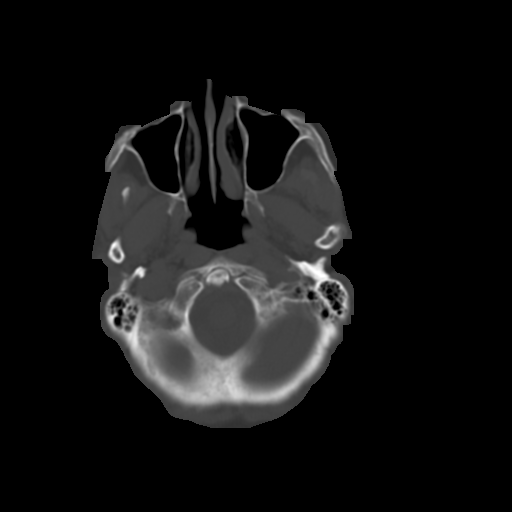
[im 5/31  brain]
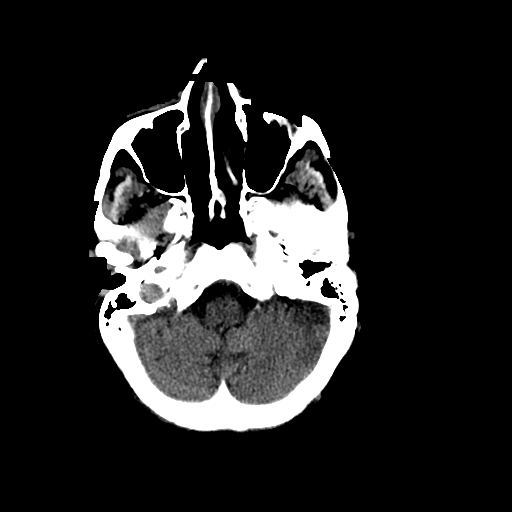
[im 7/31  brain]
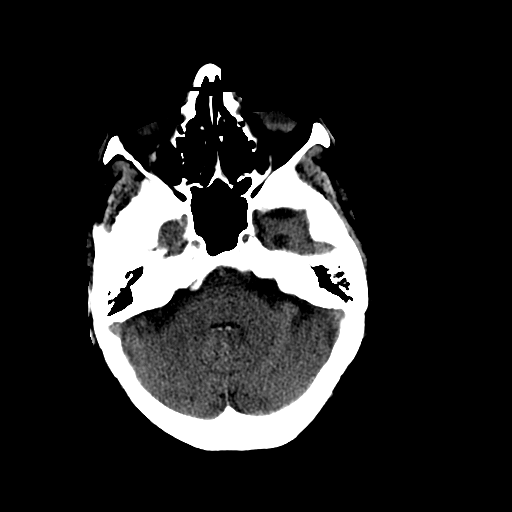
[im 9/31  brain]
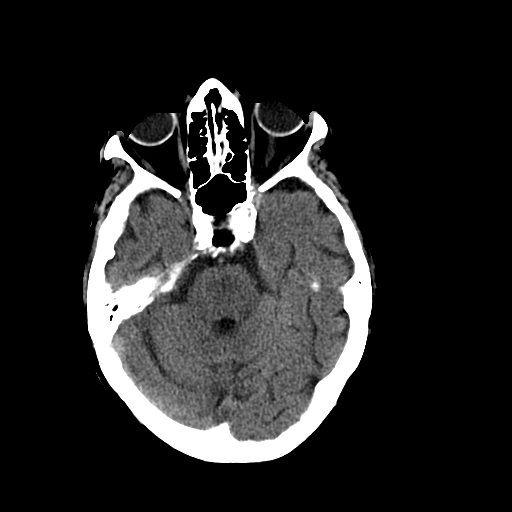
[im 11/31  brain]
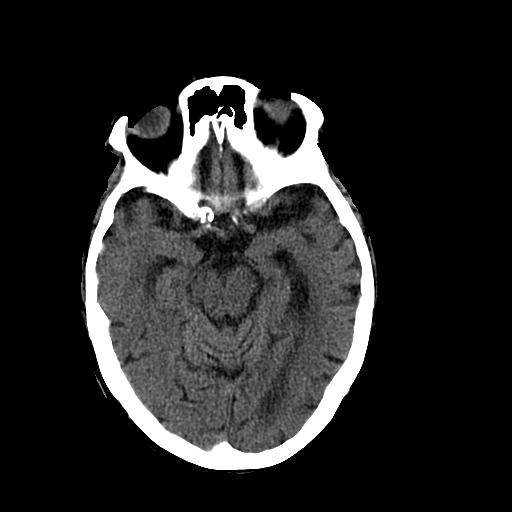
[im 11/31  bone]
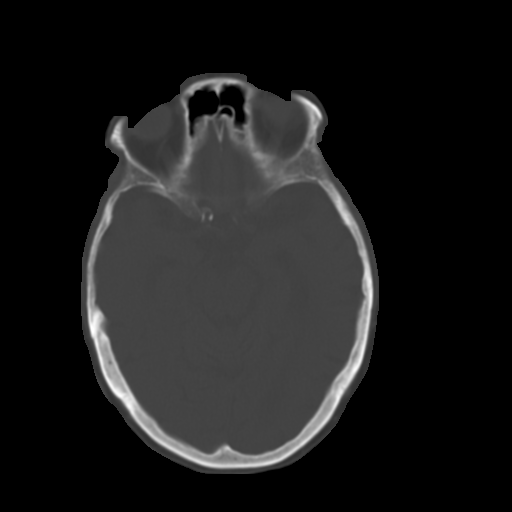
[im 13/31  brain]
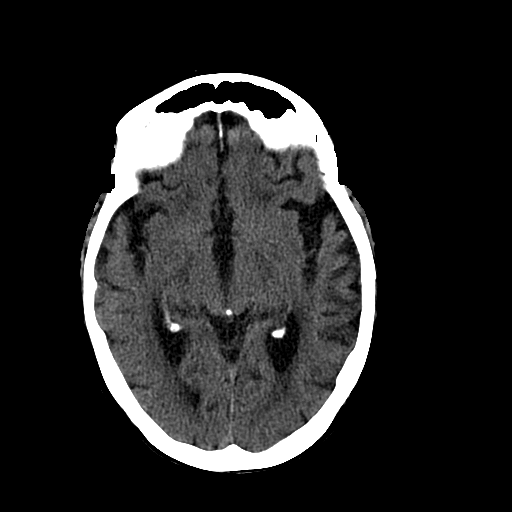
[im 16/31  brain]
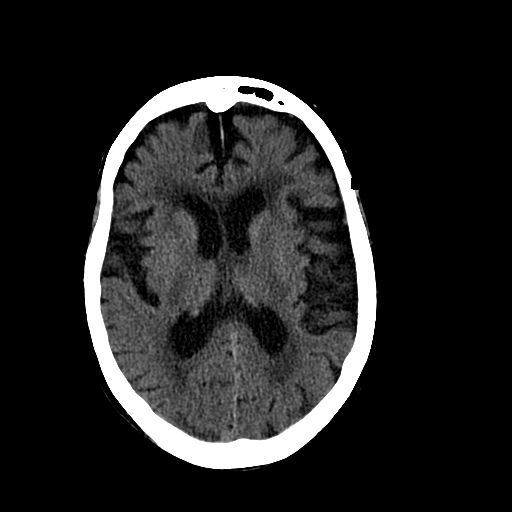
[im 18/31  brain]
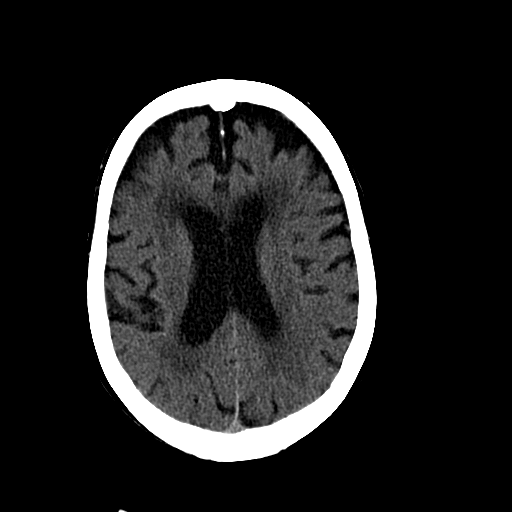
[im 20/31  brain]
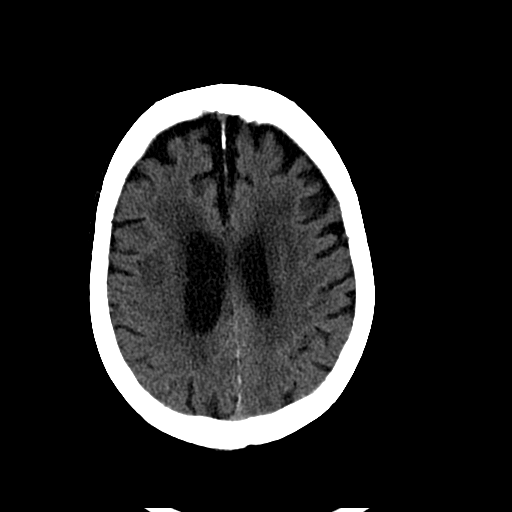
[im 20/31  bone]
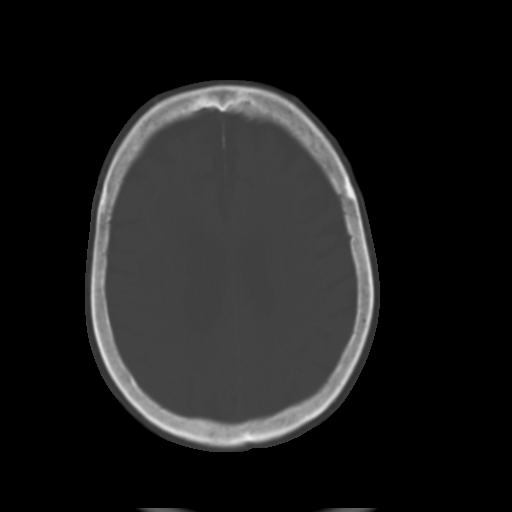
[im 22/31  brain]
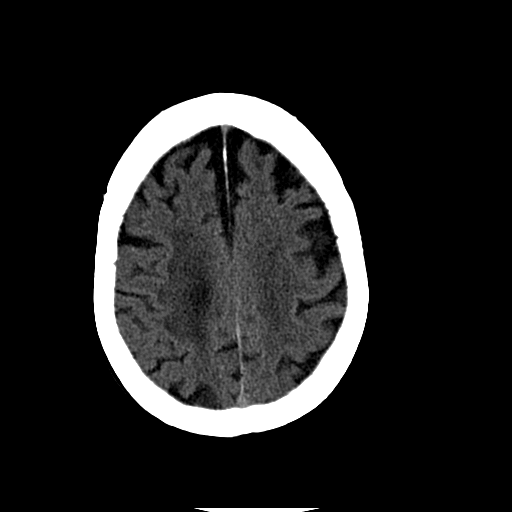
[im 24/31  brain]
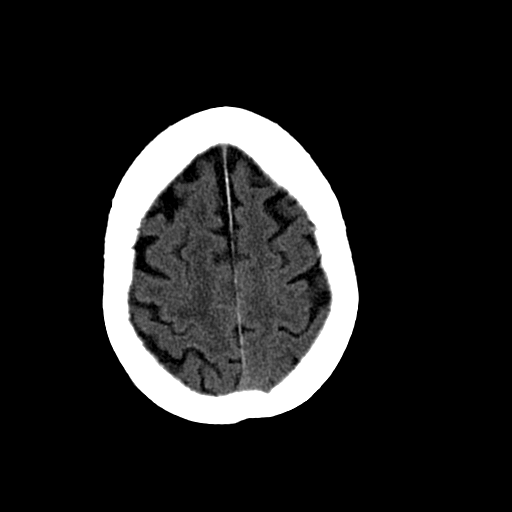
[im 26/31  brain]
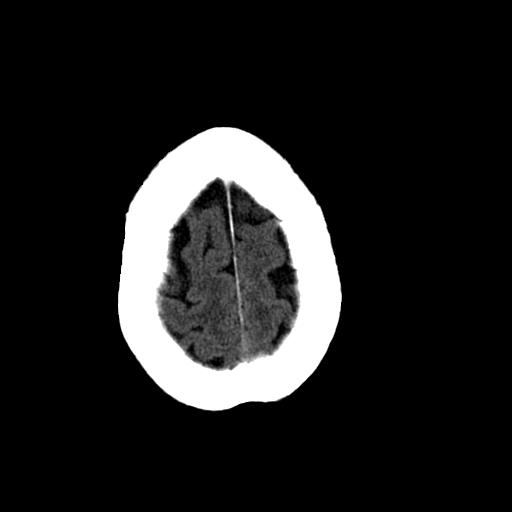
[im 28/31  brain]
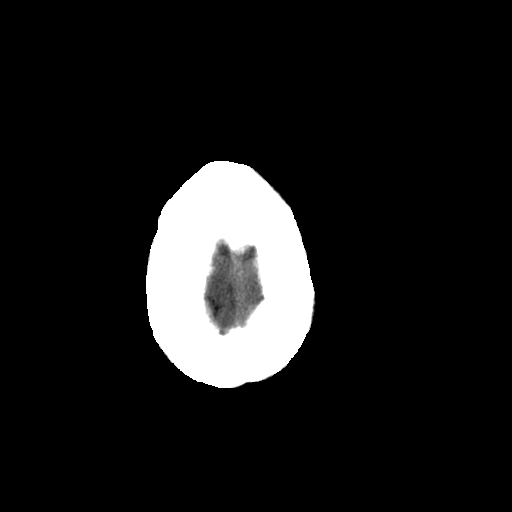
[im 28/31  bone]
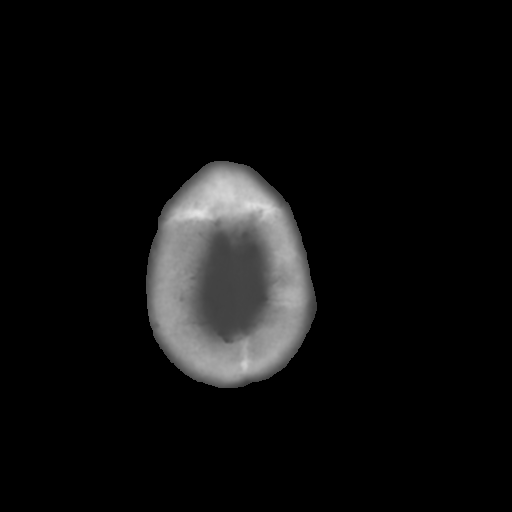

[Series 202: head w/o bone, idose (1) · axial · non-contrast · 0.49mm/px · z∈[+58,+98]mm · 3 of 31 slices shown]
[im 3/31  bone]
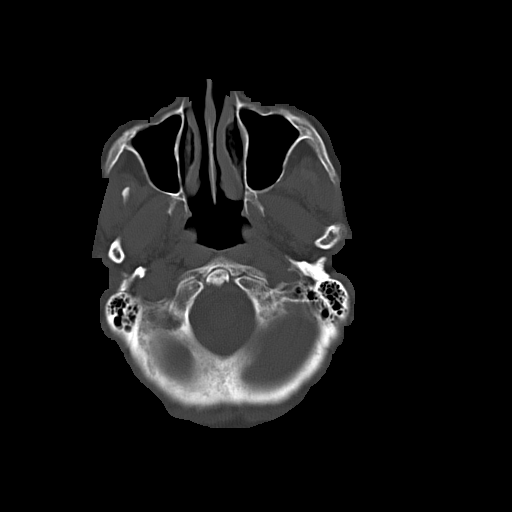
[im 7/31  bone]
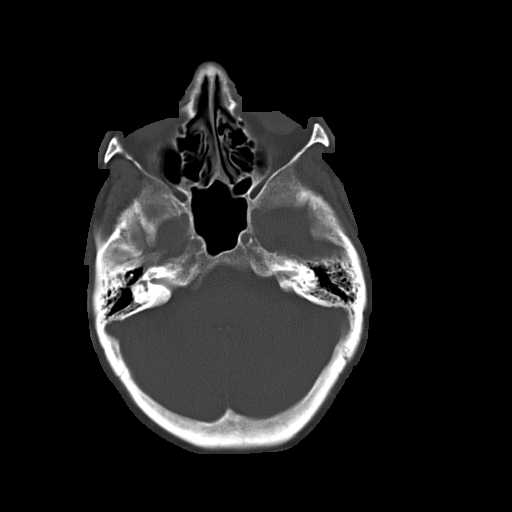
[im 11/31  bone]
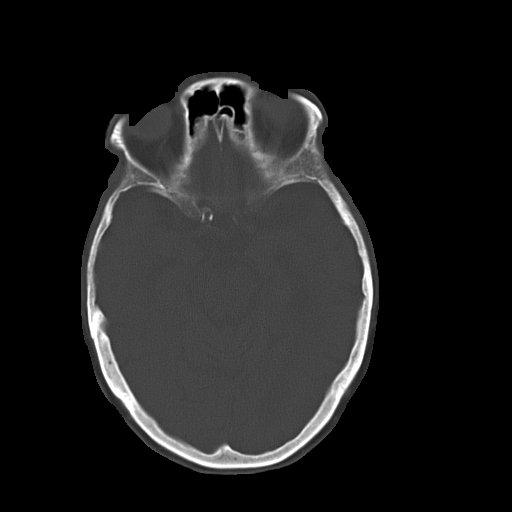

[16 of 30 positions shown; findings below may reference images not displayed]

FINDINGS: Advanced atrophy with chronic microvascular ischemic change is
stable. No visible acute stroke or hemorrhage. No mass lesion or
extra-axial fluid. Hydrocephalus ex vacuo. Calvarium intact.
Vascular calcification without signs of large vessel occlusion.
Extracranial soft tissues unremarkable.
IMPRESSION: Stable exam. Atrophy and small vessel disease without visible acute
intracranial findings.
